# Patient Record
Sex: Female | Born: 1990 | Race: White | Hispanic: No | Marital: Married | Smoking: Never smoker
Health system: Southern US, Community
[De-identification: ages and names within clinical notes are randomized; demographics above are authoritative.]

## PROBLEM LIST (undated history)

## (undated) DIAGNOSIS — F429 Obsessive-compulsive disorder, unspecified: Secondary | ICD-10-CM

## (undated) DIAGNOSIS — T7840XA Allergy, unspecified, initial encounter: Secondary | ICD-10-CM

## (undated) DIAGNOSIS — F32A Depression, unspecified: Secondary | ICD-10-CM

## (undated) DIAGNOSIS — K589 Irritable bowel syndrome without diarrhea: Secondary | ICD-10-CM

## (undated) DIAGNOSIS — E669 Obesity, unspecified: Secondary | ICD-10-CM

## (undated) DIAGNOSIS — G4730 Sleep apnea, unspecified: Secondary | ICD-10-CM

## (undated) DIAGNOSIS — D649 Anemia, unspecified: Secondary | ICD-10-CM

## (undated) DIAGNOSIS — F419 Anxiety disorder, unspecified: Secondary | ICD-10-CM

## (undated) DIAGNOSIS — I10 Essential (primary) hypertension: Secondary | ICD-10-CM

## (undated) HISTORY — DX: Sleep apnea, unspecified: G47.30

## (undated) HISTORY — DX: Obesity, unspecified: E66.9

## (undated) HISTORY — DX: Obsessive-compulsive disorder, unspecified: F42.9

## (undated) HISTORY — DX: Anxiety disorder, unspecified: F41.9

## (undated) HISTORY — DX: Allergy, unspecified, initial encounter: T78.40XA

## (undated) HISTORY — DX: Depression, unspecified: F32.A

## (undated) HISTORY — DX: Irritable bowel syndrome, unspecified: K58.9

## (undated) HISTORY — DX: Essential (primary) hypertension: I10

## (undated) HISTORY — PX: TONSILLECTOMY AND ADENOIDECTOMY: SUR1326

## (undated) HISTORY — DX: Anemia, unspecified: D64.9

## (undated) HISTORY — PX: DILATION AND CURETTAGE OF UTERUS: SHX78

---

## 2012-02-08 DIAGNOSIS — H5210 Myopia, unspecified eye: Secondary | ICD-10-CM | POA: Insufficient documentation

## 2012-06-29 ENCOUNTER — Emergency Department: Payer: Self-pay | Admitting: Emergency Medicine

## 2012-06-29 LAB — URINALYSIS, COMPLETE
Bilirubin,UR: NEGATIVE
Blood: NEGATIVE
Glucose,UR: NEGATIVE mg/dL (ref 0–75)
Nitrite: NEGATIVE
Protein: NEGATIVE
RBC,UR: 2 /HPF (ref 0–5)
Squamous Epithelial: 21
WBC UR: 3 /HPF (ref 0–5)

## 2012-06-29 LAB — LIPASE, BLOOD: Lipase: 83 U/L (ref 73–393)

## 2012-06-29 LAB — COMPREHENSIVE METABOLIC PANEL
Anion Gap: 9 (ref 7–16)
Calcium, Total: 9.2 mg/dL (ref 8.5–10.1)
Co2: 21 mmol/L (ref 21–32)
Creatinine: 0.5 mg/dL — ABNORMAL LOW (ref 0.60–1.30)
Potassium: 3.7 mmol/L (ref 3.5–5.1)
SGOT(AST): 25 U/L (ref 15–37)
Total Protein: 7.4 g/dL (ref 6.4–8.2)

## 2012-06-29 LAB — CBC
HGB: 12.3 g/dL (ref 12.0–16.0)
MCH: 26.2 pg (ref 26.0–34.0)
MCHC: 33.4 g/dL (ref 32.0–36.0)
MCV: 78 fL — ABNORMAL LOW (ref 80–100)
Platelet: 256 10*3/uL (ref 150–440)
RBC: 4.68 10*6/uL (ref 3.80–5.20)
RDW: 15 % — ABNORMAL HIGH (ref 11.5–14.5)
WBC: 10.9 10*3/uL (ref 3.6–11.0)

## 2015-07-12 ENCOUNTER — Ambulatory Visit (INDEPENDENT_AMBULATORY_CARE_PROVIDER_SITE_OTHER): Payer: 59 | Admitting: Psychiatry

## 2015-07-12 ENCOUNTER — Encounter: Payer: Self-pay | Admitting: Psychiatry

## 2015-07-12 MED ORDER — TRAZODONE HCL 50 MG PO TABS: 50.0000 mg | ORAL_TABLET | Freq: Every day | ORAL | Status: DC

## 2015-07-12 MED ORDER — SERTRALINE HCL 100 MG PO TABS: 150.0000 mg | ORAL_TABLET | Freq: Every day | ORAL | Status: DC
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
## 2015-07-26 ENCOUNTER — Ambulatory Visit (INDEPENDENT_AMBULATORY_CARE_PROVIDER_SITE_OTHER): Payer: 59 | Admitting: Psychiatry

## 2015-08-05 ENCOUNTER — Ambulatory Visit (INDEPENDENT_AMBULATORY_CARE_PROVIDER_SITE_OTHER): Payer: 59 | Admitting: Psychiatry

## 2015-08-05 ENCOUNTER — Encounter: Payer: Self-pay | Admitting: Psychiatry

## 2015-08-05 VITALS — BP 138/88 | HR 120 | Temp 97.7°F | Ht 64.5 in | Wt 380.6 lb

## 2015-08-05 DIAGNOSIS — F429 Obsessive-compulsive disorder, unspecified: Secondary | ICD-10-CM | POA: Diagnosis not present

## 2015-08-05 DIAGNOSIS — F3342 Major depressive disorder, recurrent, in full remission: Secondary | ICD-10-CM | POA: Insufficient documentation

## 2015-08-05 DIAGNOSIS — F3341 Major depressive disorder, recurrent, in partial remission: Secondary | ICD-10-CM | POA: Insufficient documentation

## 2015-08-05 DIAGNOSIS — F331 Major depressive disorder, recurrent, moderate: Secondary | ICD-10-CM

## 2015-08-05 MED ORDER — SERTRALINE HCL 100 MG PO TABS: 200.0000 mg | ORAL_TABLET | Freq: Every day | ORAL | Status: DC

## 2015-08-05 NOTE — Progress Notes (Signed)
Patient ID: Nancy Molina Vandyne, female   DOB: 11/26/1990, 25 y.o.   MRN: [id number]  Psychiatric Progress note  Patient Identification: Nancy Molina [place] MRN:  [id number] Date of Evaluation:  08/05/2015 Chief Complaint:   Chief Complaint    Follow-up; Medication Refill; Anxiety     Visit Diagnosis: MDD, OCD, anxiety Diagnosis:  There are no active problems to display for this patient.  History of Present Illness:  Patient is a 25 year old Caucasian female with a long history of major depressive disorder, obsessive-compulsive disorder and binge eating disorder. She presents today for a follow up.  She reports that she has been sleeping much better on the trazodone. Getting up to 7-8 hours of sleep. She reports that increasing the Zoloft to 150 mg has helped with the depression. On a scale of 1-10 with 10 being her best mood and one the worst she is at a 5. However reports that she has been noticing more obsessive-compulsive symptoms. She notices that she counts and checks more. She also agrees that maybe she is more  present to notice these symptoms. Things are better at home. She is enjoying her life better. She does report that the OCD symptoms do bother her. Her binging has reduced from about 3-4 times a week to about once a week.  Past Medical History:  Past Medical History  Diagnosis Date  . Obsessive-compulsive disorder   . Anxiety   . Obesity     Past Surgical History  Procedure Laterality Date  . Tonsillectomy and adenoidectomy    . Dilation and curettage of uterus     Family History:  Family History  Problem Relation Age of Onset  . Obesity Father   . Heart disease Father   . Alcohol abuse Father   . Depression Father   . Polycystic ovary syndrome Sister    Social History:   Social History   Social History  . Marital Status: Married    Spouse Name: N/A  . Number of Children: N/A  . Years of Education: N/A   Social History Main Topics  . Smoking status: Never Smoker    . Smokeless tobacco: Never Used  . Alcohol Use: No  . Drug Use: No  . Sexual Activity: Yes    Birth Control/ Protection: Pill   Other Topics Concern  . None   Social History Narrative   Additional Social History: Patient is married and has a 26-year-old child. She recently moved from [place] and her mother and sister also live close by. Reports a supportive relationship with the family.  Musculoskeletal: Strength & Muscle Tone: within normal limits Gait & Station: normal Patient leans: N/A  Psychiatric Specialty Exam: Anxiety      ROS  Blood pressure 138/88, pulse 120, temperature 97.7 F (36.5 C), temperature source Tympanic, height 5' 4.5" (1.638 m), weight 380 lb 9.6 oz (172.639 kg), last menstrual period 07/19/2015, SpO2 97 %.Body mass index is 64.34 kg/(m^2).  General Appearance: Casual  Eye Contact:  Fair  Speech:  Clear and Coherent  Volume:  Normal  Mood:  improved  Affect:  brighter  Thought Process:  Coherent  Orientation:  Full (Time, Place, and Person)  Thought Content:  WDL  Suicidal Thoughts:  No  Homicidal Thoughts:  No  Memory:  Immediate;   Fair Recent;   Fair Remote;   Fair  Judgement:  Fair  Insight:  Fair  Psychomotor Activity:  Normal  Concentration:  Fair  Recall:  [employer]  Fund of Knowledge:Fair  Language: Fair  Akathisia:  No  Handed:  Right  AIMS (if indicated):  na  Assets:  Communication Skills Desire for Improvement Financial Resources/Insurance Housing Social Support Vocational/Educational  ADL's:  Intact  Cognition: WNL  Sleep:  poor   Is the patient at risk to self?  No. Has the patient been a risk to self in the past 6 months?  No. Has the patient been a risk to self within the distant past?  No. Is the patient a risk to others?  No. Has the patient been a risk to others in the past 6 months?  No. Has the patient been a risk to others within the distant past?  No.  Allergies:   Allergies  Allergen Reactions  .  Clindamycin/Lincomycin Shortness Of Breath  . Methocarbamol Shortness Of Breath  . Clindamycin Swelling   Current Medications: Current Outpatient Prescriptions  Medication Sig Dispense Refill  . Loratadine 10 MG CAPS Take by mouth.    . sertraline (ZOLOFT) 100 MG tablet Take 1.5 tablets (150 mg total) by mouth daily. 45 tablet 1  . SPRINTEC 28 0.25-35 MG-MCG tablet Take 1 tablet by mouth daily.  0  . traZODone (DESYREL) 50 MG tablet Take 1 tablet (50 mg total) by mouth at bedtime. 30 tablet 1   No current facility-administered medications for this visit.    Previous Psychotropic Medications: No   Substance Abuse History in the last 12 months:  No.  Consequences of Substance Abuse: Negative  Medical Decision Making:  Review of Psycho-Social Stressors (1), Review or order clinical lab tests (1), Review and summation of old records (2), Established Problem, Worsening (2), Review of Medication Regimen & Side Effects (2) and Review of New Medication or Change in Dosage (2)  Treatment Plan Summary: Medication management Major depressive disorder  Increase Zoloft to 200 mg daily Discussed taking walks and increasing exercise, spending time with family Recommend starting to see a therapist  Obsessive compulsive disorder/ anxiety Same as above  Insomnia Continue trazodone at 50 mg by mouth daily at bedtime   binge eating disorder Consider starting Vyvanse once anxiety and depression are under control.  Return to clinic in 4 weeks time or call before if necessary    Claretta Kendra 3/23/20178:44 AM

## 2015-08-26 ENCOUNTER — Ambulatory Visit: Payer: 59 | Admitting: Psychiatry

## 2015-09-01 ENCOUNTER — Other Ambulatory Visit: Payer: Self-pay | Admitting: Psychiatry

## 2015-09-01 NOTE — Telephone Encounter (Signed)
I approved the effexor, please have another doctor sign the prescription or call it in. thanks

## 2015-09-01 NOTE — Telephone Encounter (Signed)
pt needs refill on her trazodone.  pt was last seen on  08-05-15 next appt  09-09-15

## 2015-09-09 ENCOUNTER — Ambulatory Visit: Payer: 59 | Admitting: Psychiatry

## 2015-09-16 ENCOUNTER — Encounter: Payer: Self-pay | Admitting: Psychiatry

## 2015-09-16 ENCOUNTER — Ambulatory Visit (INDEPENDENT_AMBULATORY_CARE_PROVIDER_SITE_OTHER): Payer: 59 | Admitting: Psychiatry

## 2015-09-16 VITALS — BP 124/84 | HR 121 | Temp 97.6°F | Ht 64.5 in | Wt 381.4 lb

## 2015-09-16 DIAGNOSIS — F429 Obsessive-compulsive disorder, unspecified: Secondary | ICD-10-CM

## 2015-09-16 DIAGNOSIS — F331 Major depressive disorder, recurrent, moderate: Secondary | ICD-10-CM

## 2015-09-16 MED ORDER — SERTRALINE HCL 100 MG PO TABS: 200.0000 mg | ORAL_TABLET | Freq: Every day | ORAL | Status: DC

## 2015-09-16 MED ORDER — TRAZODONE HCL 50 MG PO TABS: 50.0000 mg | ORAL_TABLET | Freq: Every day | ORAL | Status: DC

## 2015-09-16 NOTE — Progress Notes (Signed)
Patient ID: Nancy Molina, female   DOB: 08/05/1990, 25 y.o.   MRN: [id number]   Psychiatric Progress note  Patient Identification: Nancy Molina MRN:  [id number] Date of Evaluation:  09/16/2015 Chief Complaint:    Visit Diagnosis: MDD, OCD, anxiety Diagnosis:   Patient Active Problem List   Diagnosis Date Noted  . MDD (major depressive disorder), recurrent episode, moderate (HCC) [F33.1] 08/05/2015  . OCD (obsessive compulsive disorder) [F42.9] 08/05/2015   History of Present Illness:  Patient is a 25 year old Caucasian female with a long history of major depressive disorder, obsessive-compulsive disorder and binge eating disorder. She presents today for a follow up.  She has been able to tolerate the zoloft at  200mg , mood has significantly improved. States she is sleeping well and eating healthy. She has not been bingeing recently. Her OCD is better but states 40% of her daily time is spent in OCD rituals such as counting , knocking on things when anxious, checking things. States it is tolerable though. She is quite anxious about A cousin's upcoming wedding and all the activities surrounding it. States that she will have to throw the bridal shower and organize a few other things and this stresses her out. Overall reports her mood has improved but she continues to have some significant anxiety related to everyday things.    Past Medical History:  Past Medical History  Diagnosis Date  . Obsessive-compulsive disorder   . Anxiety   . Obesity     Past Surgical History  Procedure Laterality Date  . Tonsillectomy and adenoidectomy    . Dilation and curettage of uterus     Family History:  Family History  Problem Relation Age of Onset  . Obesity Father   . Heart disease Father   . Alcohol abuse Father   . Depression Father   . Polycystic ovary syndrome Sister    Social History:   Social History   Social History  . Marital Status: Married    Spouse Name: N/A  . Number of  Children: N/A  . Years of Education: N/A   Social History Main Topics  . Smoking status: Never Smoker   . Smokeless tobacco: Never Used  . Alcohol Use: No  . Drug Use: No  . Sexual Activity: Yes    Birth Control/ Protection: Pill   Other Topics Concern  . Not on file   Social History Narrative   Additional Social History: Patient is married and has a 26-year-old child. She recently moved from [place] and her mother and sister also live close by. Reports a supportive relationship with the family.  Musculoskeletal: Strength & Muscle Tone: within normal limits Gait & Station: normal Patient leans: N/A  Psychiatric Specialty Exam: Anxiety      ROS  There were no vitals taken for this visit.There is no weight on file to calculate BMI.  General Appearance: Casual  Eye Contact:  Fair  Speech:  Clear and Coherent  Volume:  Normal  Mood:  improved  Affect:  brighter  Thought Process:  Coherent  Orientation:  Full (Time, Place, and Person)  Thought Content:  WDL  Suicidal Thoughts:  No  Homicidal Thoughts:  No  Memory:  Immediate;   Fair Recent;   Fair Remote;   Fair  Judgement:  Fair  Insight:  Fair  Psychomotor Activity:  Normal  Concentration:  Fair  Recall:  [employer]  Fund of Knowledge:Fair  Language: Fair  Akathisia:  No  Handed:  Right  AIMS (  if indicated):  na  Assets:  Communication Skills Desire for Improvement Financial Resources/Insurance Housing Social Support Vocational/Educational  ADL's:  Intact  Cognition: WNL  Sleep:  poor   Is the patient at risk to self?  No. Has the patient been a risk to self in the past 6 months?  No. Has the patient been a risk to self within the distant past?  No. Is the patient a risk to others?  No. Has the patient been a risk to others in the past 6 months?  No. Has the patient been a risk to others within the distant past?  No.  Allergies:   Allergies  Allergen Reactions  . Clindamycin/Lincomycin Shortness Of  Breath  . Methocarbamol Shortness Of Breath  . Clindamycin Swelling   Current Medications: Current Outpatient Prescriptions  Medication Sig Dispense Refill  . Loratadine 10 MG CAPS Take by mouth.    . sertraline (ZOLOFT) 100 MG tablet Take 2 tablets (200 mg total) by mouth daily. 60 tablet 1  . SPRINTEC 28 0.25-35 MG-MCG tablet Take 1 tablet by mouth daily.  0  . traZODone (DESYREL) 50 MG tablet TAKE 1 TABLET BY MOUTH AT BEDTIME 30 tablet 0   No current facility-administered medications for this visit.    Previous Psychotropic Medications: No   Substance Abuse History in the last 12 months:  No.  Consequences of Substance Abuse: Negative  Medical Decision Making:  Review of Psycho-Social Stressors (1), Review or order clinical lab tests (1), Review and summation of old records (2), Established Problem, Worsening (2), Review of Medication Regimen & Side Effects (2) and Review of New Medication or Change in Dosage (2)  Treatment Plan Summary: Medication management Major depressive disorder  Continue Zoloft at 200 mg daily Discussed taking walks and increasing exercise, spending time with family Recommend starting to see a therapist, she is agreeable to starting now.  Obsessive compulsive disorder/ anxiety Same as above  Insomnia Continue trazodone at 50 mg by mouth daily at bedtime   binge eating disorder   Return to clinic in 4 weeks time or call before if necessary    Fynley Chrystal 5/4/201711:41 AM

## 2015-10-01 ENCOUNTER — Ambulatory Visit: Payer: 59 | Admitting: Licensed Clinical Social Worker

## 2015-10-12 ENCOUNTER — Ambulatory Visit (INDEPENDENT_AMBULATORY_CARE_PROVIDER_SITE_OTHER): Payer: 59 | Admitting: Licensed Clinical Social Worker

## 2015-10-12 DIAGNOSIS — F429 Obsessive-compulsive disorder, unspecified: Secondary | ICD-10-CM | POA: Diagnosis not present
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
## 2015-11-15 ENCOUNTER — Ambulatory Visit (INDEPENDENT_AMBULATORY_CARE_PROVIDER_SITE_OTHER): Payer: 59 | Admitting: Psychiatry

## 2015-11-15 ENCOUNTER — Encounter: Payer: Self-pay | Admitting: Psychiatry

## 2015-11-15 VITALS — BP 138/88 | HR 116 | Temp 97.3°F | Ht 64.5 in | Wt 380.0 lb

## 2015-11-15 DIAGNOSIS — F331 Major depressive disorder, recurrent, moderate: Secondary | ICD-10-CM

## 2015-11-15 DIAGNOSIS — F429 Obsessive-compulsive disorder, unspecified: Secondary | ICD-10-CM | POA: Diagnosis not present

## 2015-11-15 MED ORDER — TRAZODONE HCL 50 MG PO TABS: 50.0000 mg | ORAL_TABLET | Freq: Every day | ORAL | Status: DC

## 2015-11-15 MED ORDER — SERTRALINE HCL 100 MG PO TABS: 200.0000 mg | ORAL_TABLET | Freq: Every day | ORAL | Status: DC

## 2015-11-15 NOTE — Progress Notes (Signed)
Patient ID: Nancy Molina, female   DOB: 07/07/1990, 25 y.o.   MRN: [id number]    Psychiatric Progress note  Patient Identification: Nancy Molina MRN:  [id number] Date of Evaluation:  11/15/2015 Chief Complaint:    Visit Diagnosis: MDD, OCD, anxiety Diagnosis:   Patient Active Problem List   Diagnosis Date Noted  . MDD (major depressive disorder), recurrent episode, moderate (HCC) [F33.1] 08/05/2015  . OCD (obsessive compulsive disorder) [F42.9] 08/05/2015   History of Present Illness:  Patient is a 25 year old Caucasian female with a long history of major depressive disorder, obsessive-compulsive disorder and binge eating disorder. She presents today for a follow up.  Reports her mood has leveled out, sleeping better. States she is not bingeing, cannot recall the last time she has binged. Reports her OCD has improved as well. Patient reports that she continues to have anxiety about something happening to her family members. However she does realize that his anxiety and is able to mourn with her day. Denies any suicidal thoughts. Continues to stress somewhat about her cousin's wedding next month but doing okay overall.  Past Medical History:  Past Medical History  Diagnosis Date  . Obsessive-compulsive disorder   . Anxiety   . Obesity     Past Surgical History  Procedure Laterality Date  . Tonsillectomy and adenoidectomy    . Dilation and curettage of uterus     Family History:  Family History  Problem Relation Age of Onset  . Obesity Father   . Heart disease Father   . Alcohol abuse Father   . Depression Father   . Polycystic ovary syndrome Sister    Social History:   Social History   Social History  . Marital Status: Married    Spouse Name: N/A  . Number of Children: N/A  . Years of Education: N/A   Social History Main Topics  . Smoking status: Never Smoker   . Smokeless tobacco: Never Used  . Alcohol Use: No  . Drug Use: No  . Sexual Activity: Yes   Birth Control/ Protection: Pill   Other Topics Concern  . Not on file   Social History Narrative   Additional Social History: Patient is married and has a 26-year-old child. She recently moved from [place] and her mother and sister also live close by. Reports a supportive relationship with the family.  Musculoskeletal: Strength & Muscle Tone: within normal limits Gait & Station: normal Patient leans: N/A  Psychiatric Specialty Exam: Anxiety      ROS  There were no vitals taken for this visit.There is no weight on file to calculate BMI.  General Appearance: Casual  Eye Contact:  Fair  Speech:  Clear and Coherent  Volume:  Normal  Mood:  improved  Affect:  brighter  Thought Process:  Coherent  Orientation:  Full (Time, Place, and Person)  Thought Content:  WDL  Suicidal Thoughts:  No  Homicidal Thoughts:  No  Memory:  Immediate;   Fair Recent;   Fair Remote;   Fair  Judgement:  Fair  Insight:  Fair  Psychomotor Activity:  Normal  Concentration:  Fair  Recall:  [employer]  Fund of Knowledge:Fair  Language: Fair  Akathisia:  No  Handed:  Right  AIMS (if indicated):  na  Assets:  Communication Skills Desire for Improvement Financial Resources/Insurance Housing Social Support Vocational/Educational  ADL's:  Intact  Cognition: WNL  Sleep:  poor   Is the patient at risk to self?  No. Has the  patient been a risk to self in the past 6 months?  No. Has the patient been a risk to self within the distant past?  No. Is the patient a risk to others?  No. Has the patient been a risk to others in the past 6 months?  No. Has the patient been a risk to others within the distant past?  No.  Allergies:   Allergies  Allergen Reactions  . Clindamycin/Lincomycin Shortness Of Breath  . Methocarbamol Shortness Of Breath  . Clindamycin Swelling   Current Medications: Current Outpatient Prescriptions  Medication Sig Dispense Refill  . Loratadine 10 MG CAPS Take by mouth.  Reported on 09/16/2015    . sertraline (ZOLOFT) 100 MG tablet Take 2 tablets (200 mg total) by mouth daily. 60 tablet 1  . SPRINTEC 28 0.25-35 MG-MCG tablet Take 1 tablet by mouth daily.  0  . traZODone (DESYREL) 50 MG tablet Take 1 tablet (50 mg total) by mouth at bedtime. 30 tablet 1   No current facility-administered medications for this visit.    Previous Psychotropic Medications: No   Substance Abuse History in the last 12 months:  No.  Consequences of Substance Abuse: Negative  Medical Decision Making:  Review of Psycho-Social Stressors (1), Review or order clinical lab tests (1), Review and summation of old records (2), Established Problem, Worsening (2), Review of Medication Regimen & Side Effects (2) and Review of New Medication or Change in Dosage (2)  Treatment Plan Summary: Medication management Major depressive disorder  Continue Zoloft at 200 mg daily Discussed taking walks and increasing exercise, spending time with family Started seeing [name] for therapy.  Obsessive compulsive disorder/ anxiety Same as above  Insomnia Continue trazodone at 50 mg by mouth daily at bedtime   binge eating disorder Doing well  Return to clinic in 3 months  time or call before if necessary    Jacquelin Krajewski 7/3/20178:52 AM

## 2016-02-15 ENCOUNTER — Ambulatory Visit: Payer: 59 | Admitting: Psychiatry

## 2016-02-17 ENCOUNTER — Other Ambulatory Visit: Payer: Self-pay | Admitting: Psychiatry

## 2016-02-23 ENCOUNTER — Ambulatory Visit (INDEPENDENT_AMBULATORY_CARE_PROVIDER_SITE_OTHER): Payer: 59 | Admitting: Psychiatry

## 2016-02-23 ENCOUNTER — Encounter: Payer: Self-pay | Admitting: Psychiatry

## 2016-02-23 VITALS — BP 152/97 | HR 101 | Temp 98.0°F | Wt 386.2 lb

## 2016-02-23 DIAGNOSIS — F429 Obsessive-compulsive disorder, unspecified: Secondary | ICD-10-CM

## 2016-02-23 DIAGNOSIS — F331 Major depressive disorder, recurrent, moderate: Secondary | ICD-10-CM

## 2016-02-23 MED ORDER — LISDEXAMFETAMINE DIMESYLATE 20 MG PO CAPS: 20.0000 mg | ORAL_CAPSULE | Freq: Every day | ORAL | 0 refills | Status: DC

## 2016-02-23 MED ORDER — TRAZODONE HCL 50 MG PO TABS: 50.0000 mg | ORAL_TABLET | Freq: Every day | ORAL | 3 refills | Status: DC

## 2016-02-23 MED ORDER — SERTRALINE HCL 100 MG PO TABS: 200.0000 mg | ORAL_TABLET | Freq: Every day | ORAL | 3 refills | Status: DC

## 2016-02-23 NOTE — Progress Notes (Signed)
Patient ID: Nancy Molina, female   DOB: January 01, 1991, 25 y.o.   MRN: [id number]    Psychiatric Progress note  Patient Identification: Nancy Molina MRN:  [id number] Date of Evaluation:  02/23/2016 Chief Complaint:   Chief Complaint    Follow-up; Medication Refill     Visit Diagnosis: MDD, OCD, anxiety Diagnosis:   Patient Active Problem List   Diagnosis Date Noted  . MDD (major depressive disorder), recurrent episode, moderate (HCC) [F33.1] 08/05/2015  . OCD (obsessive compulsive disorder) [F42.9] 08/05/2015   History of Present Illness:  Patient is a 25 year old Caucasian female with a long history of major depressive disorder, obsessive-compulsive disorder and binge eating disorder. Patient today reports that she's been doing well in terms of her anxiety and depression. She also states that the OCD has gotten much better. Sleeping Well and eating okay. She denies any binge eating episodes. However she does report that down she does have to eat more than usual given her on early years of the binging. She is interested in looking at the further treatment for the binge eating. She also reports that the stressor which was her cousins wedding was called off and though it was a bit stressful she is doing better. She and her husband are moving into a new house and she is excited about that. Past Medical History:  Past Medical History:  Diagnosis Date  . Anxiety   . Obesity   . Obsessive-compulsive disorder     Past Surgical History:  Procedure Laterality Date  . DILATION AND CURETTAGE OF UTERUS    . TONSILLECTOMY AND ADENOIDECTOMY     Family History:  Family History  Problem Relation Age of Onset  . Obesity Father   . Heart disease Father   . Alcohol abuse Father   . Depression Father   . Polycystic ovary syndrome Sister    Social History:   Social History   Social History  . Marital status: Married    Spouse name: N/A  . Number of children: N/A  . Years of education:  N/A   Social History Main Topics  . Smoking status: Never Smoker  . Smokeless tobacco: Never Used  . Alcohol use No  . Drug use: No  . Sexual activity: Yes    Birth control/ protection: Pill   Other Topics Concern  . None   Social History Narrative  . None   Additional Social History: Patient is married and has a 31-year-old child. She recently moved from [place] and her mother and sister also live close by. Reports a supportive relationship with the family.  Musculoskeletal: Strength & Muscle Tone: within normal limits Gait & Station: normal Patient leans: N/A  Psychiatric Specialty Exam: Anxiety     Medication Refill     ROS  Blood pressure (!) 152/97, pulse (!) 101, temperature 98 F (36.7 C), temperature source Oral, weight (!) 386 lb 3.2 oz (175.2 kg), last menstrual period 02/20/2016.Body mass index is 65.27 kg/m.  General Appearance: Casual  Eye Contact:  Fair  Speech:  Clear and Coherent  Volume:  Normal  Mood:  improved  Affect:  brighter  Thought Process:  Coherent  Orientation:  Full (Time, Place, and Person)  Thought Content:  WDL  Suicidal Thoughts:  No  Homicidal Thoughts:  No  Memory:  Immediate;   Fair Recent;   Fair Remote;   Fair  Judgement:  Fair  Insight:  Fair  Psychomotor Activity:  Normal  Concentration:  Fair  Recall:  Fair  [employer] EnergyFund of Knowledge:Fair  Language: Fair  Akathisia:  No  Handed:  Right  AIMS (if indicated):  na  Assets:  Communication Skills Desire for Improvement Financial Resources/Insurance Housing Social Support Vocational/Educational  ADL's:  Intact  Cognition: WNL  Sleep:  better   Is the patient at risk to self?  No. Has the patient been a risk to self in the past 6 months?  No. Has the patient been a risk to self within the distant past?  No. Is the patient a risk to others?  No. Has the patient been a risk to others in the past 6 months?  No. Has the patient been a risk to others within the distant  past?  No.  Allergies:   Allergies  Allergen Reactions  . Clindamycin/Lincomycin Shortness Of Breath  . Methocarbamol Shortness Of Breath  . Clindamycin Swelling   Current Medications: Current Outpatient Prescriptions  Medication Sig Dispense Refill  . Loratadine 10 MG CAPS Take by mouth. Reported on 09/16/2015    . sertraline (ZOLOFT) 100 MG tablet Take 2 tablets (200 mg total) by mouth daily. 60 tablet 2  . SPRINTEC 28 0.25-35 MG-MCG tablet Take 1 tablet by mouth daily.  0  . traZODone (DESYREL) 50 MG tablet Take 1 tablet (50 mg total) by mouth at bedtime. 30 tablet 2   No current facility-administered medications for this visit.     Previous Psychotropic Medications: No   Substance Abuse History in the last 12 months:  No.  Consequences of Substance Abuse: Negative  Medical Decision Making:  Review of Psycho-Social Stressors (1), Review or order clinical lab tests (1), Review and summation of old records (2), Established Problem, Worsening (2), Review of Medication Regimen & Side Effects (2) and Review of New Medication or Change in Dosage (2)  Treatment Plan Summary: Medication management   Major depressive disorder  Continue Zoloft at 200 mg daily Discussed taking walks and increasing exercise, spending time with family  Obsessive compulsive disorder/ anxiety Same as above  Insomnia Continue trazodone at 50 mg by mouth daily at bedtime   binge eating disorder Start Vyvanse at 20mg  po qam.  High blood pressure and weight Patient reports that she is stressed because of the move to the new house and the she and her husband are planning to eat healthy and buy a treadmill as well. Patient recommended to follow up with her primary care physician to address any hypertension issues  Return to clinic in 1 months  time or call before if necessary    Nancy Molina 10/11/20179:03 AM

## 2016-03-21 ENCOUNTER — Encounter: Payer: Self-pay | Admitting: Psychiatry

## 2016-03-21 ENCOUNTER — Ambulatory Visit (INDEPENDENT_AMBULATORY_CARE_PROVIDER_SITE_OTHER): Payer: 59 | Admitting: Psychiatry

## 2016-03-21 VITALS — BP 136/86 | HR 97 | Temp 97.5°F | Resp 18 | Wt 386.0 lb

## 2016-03-21 DIAGNOSIS — F50814 Binge eating disorder, in remission: Secondary | ICD-10-CM

## 2016-03-21 DIAGNOSIS — F331 Major depressive disorder, recurrent, moderate: Secondary | ICD-10-CM | POA: Diagnosis not present

## 2016-03-21 DIAGNOSIS — F429 Obsessive-compulsive disorder, unspecified: Secondary | ICD-10-CM

## 2016-03-21 DIAGNOSIS — F5081 Binge eating disorder: Secondary | ICD-10-CM

## 2016-03-21 MED ORDER — LISDEXAMFETAMINE DIMESYLATE 30 MG PO CAPS: 30.0000 mg | ORAL_CAPSULE | Freq: Every day | ORAL | 0 refills | Status: DC

## 2016-03-21 NOTE — Progress Notes (Signed)
Patient ID: Nancy Molina, female   DOB: 08/27/1990, 25 y.o.   MRN: [id number]    Psychiatric Progress note  Patient Identification: Nancy Molina MRN:  [id number] Date of Evaluation:  03/21/2016 Chief Complaint:    Visit Diagnosis: MDD, OCD, anxiety Diagnosis:   There are no active problems to display for this patient.  History of Present Illness:  Patient is a 25 year old Caucasian female with a long history of major depressive disorder, obsessive-compulsive disorder and binge eating disorder. Patient today reports that she's been doing well in terms of her anxiety and depression.Patient reports that she has tolerated the Vyvanse well and states it has curbed her appetite somewhat in the mornings. However reports that it seems to wear off in the afternoon. States that she is working on retraining herself to eat less. Reports that her mood is doing good overall and denies any problems. She has been sleeping well. Her OCD is also more stable.. Past Medical History:  Past Medical History:  Diagnosis Date  . Anxiety   . Obesity   . Obsessive-compulsive disorder     Past Surgical History:  Procedure Laterality Date  . DILATION AND CURETTAGE OF UTERUS    . TONSILLECTOMY AND ADENOIDECTOMY     Family History:  Family History  Problem Relation Age of Onset  . Obesity Father   . Heart disease Father   . Alcohol abuse Father   . Depression Father   . Polycystic ovary syndrome Sister    Social History:   Social History   Social History  . Marital status: Married    Spouse name: N/A  . Number of children: N/A  . Years of education: N/A   Social History Main Topics  . Smoking status: Never Smoker  . Smokeless tobacco: Never Used  . Alcohol use No  . Drug use: No  . Sexual activity: Yes    Birth control/ protection: Pill   Other Topics Concern  . None   Social History Narrative  . None   Additional Social History: Patient is married and has a 26-year-old child. She  recent[place] moved from [place] and her mother and sister also live close by. Reports a supportive relationship with the family.  Musculoskeletal: Strength & Muscle Tone: within normal limits Gait & Station: normal Patient leans: N/A  Psychiatric Specialty Exam: Medication Refill   Anxiety       ROS  Blood pressure 136/86, pulse 97, temperature 97.5 F (36.4 C), temperature source Tympanic, resp. rate 18, weight (!) 386 lb (175.1 kg), last menstrual period 02/20/2016, SpO2 98 %.Body mass index is 65.23 kg/m.  General Appearance: Casual  Eye Contact:  Fair  Speech:  Clear and Coherent  Volume:  Normal  Mood:  Good   Affect:  Full range   Thought Process:  Coherent  Orientation:  Full (Time, Place, and Person)  Thought Content:  WDL  Suicidal Thoughts:  No  Homicidal Thoughts:  No  Memory:  Immediate;   Fair Recent;   Fair Remote;   Fair  Judgement:  Fair  Insight:  Fair  Psychomotor Activity:  Normal  Concentration:  Fair  Recall:  [employer]  Fund of Knowledge:Fair  Language: Fair  Akathisia:  No  Handed:  Right  AIMS (if indicated):  na  Assets:  Communication Skills Desire for Improvement Financial Resources/Insurance Housing Social Support Vocational/Educational  ADL's:  Intact  Cognition: WNL  Sleep:  better   Is the patient at risk to self?  No.  Has the patient been a risk to self in the past 6 months?  No. Has the patient been a risk to self within the distant past?  No. Is the patient a risk to others?  No. Has the patient been a risk to others in the past 6 months?  No. Has the patient been a risk to others within the distant past?  No.  Allergies:   Allergies  Allergen Reactions  . Clindamycin/Lincomycin Shortness Of Breath  . Methocarbamol Shortness Of Breath  . Clindamycin Swelling   Current Medications: Current Outpatient Prescriptions  Medication Sig Dispense Refill  . lisdexamfetamine (VYVANSE) 30 MG capsule Take 1 capsule (30 mg total) by  mouth daily. 30 capsule 0  . Loratadine 10 MG CAPS Take by mouth. Reported on 09/16/2015    . sertraline (ZOLOFT) 100 MG tablet Take 2 tablets (200 mg total) by mouth daily. 60 tablet 3  . SPRINTEC 28 0.25-35 MG-MCG tablet Take 1 tablet by mouth daily.  0  . traZODone (DESYREL) 50 MG tablet Take 1 tablet (50 mg total) by mouth at bedtime. 30 tablet 3   No current facility-administered medications for this visit.     Previous Psychotropic Medications: No   Substance Abuse History in the last 12 months:  No.  Consequences of Substance Abuse: Negative  Medical Decision Making:  Review of Psycho-Social Stressors (1), Review or order clinical lab tests (1), Review and summation of old records (2), Established Problem, Worsening (2), Review of Medication Regimen & Side Effects (2) and Review of New Medication or Change in Dosage (2)  Treatment Plan Summary: Medication management   Major depressive disorder  Continue Zoloft at 200 mg daily Discussed taking walks and increasing exercise, spending time with family  Obsessive compulsive disorder/ anxiety Same as above  Insomnia Continue trazodone at 50 mg by mouth daily at bedtime   binge eating disorder Increase Vyvanse to 30 mg once daily Diet therapy with Nancy Molina to address issues around the binge eating   Return to clinic in 1 months  time or call before if necessary    Nancy Molina 11/7/20178:58 AM

## 2016-04-19 ENCOUNTER — Ambulatory Visit: Payer: 59 | Admitting: Licensed Clinical Social Worker

## 2016-04-19 ENCOUNTER — Ambulatory Visit: Payer: 59 | Admitting: Psychiatry

## 2019-03-25 DIAGNOSIS — H5213 Myopia, bilateral: Secondary | ICD-10-CM | POA: Insufficient documentation

## 2019-10-15 ENCOUNTER — Ambulatory Visit: Payer: BC Managed Care – PPO | Admitting: Nurse Practitioner

## 2019-10-17 ENCOUNTER — Other Ambulatory Visit: Payer: Self-pay

## 2019-10-21 ENCOUNTER — Ambulatory Visit (INDEPENDENT_AMBULATORY_CARE_PROVIDER_SITE_OTHER): Payer: BC Managed Care – PPO | Admitting: Nurse Practitioner

## 2019-10-21 ENCOUNTER — Encounter: Payer: Self-pay | Admitting: Nurse Practitioner

## 2019-10-21 ENCOUNTER — Other Ambulatory Visit: Payer: Self-pay

## 2019-10-21 VITALS — BP 126/84 | HR 115 | Temp 97.5°F | Ht 65.0 in | Wt >= 6400 oz

## 2019-10-21 DIAGNOSIS — F418 Other specified anxiety disorders: Secondary | ICD-10-CM | POA: Diagnosis not present

## 2019-10-21 DIAGNOSIS — Z0000 Encounter for general adult medical examination without abnormal findings: Secondary | ICD-10-CM | POA: Diagnosis not present

## 2019-10-21 LAB — CBC WITH DIFFERENTIAL/PLATELET
Basophils Absolute: 0.1 10*3/uL (ref 0.0–0.1)
Basophils Relative: 0.6 % (ref 0.0–3.0)
Eosinophils Absolute: 0.2 10*3/uL (ref 0.0–0.7)
Eosinophils Relative: 1.6 % (ref 0.0–5.0)
HCT: 39.4 % (ref 36.0–46.0)
Hemoglobin: 13 g/dL (ref 12.0–15.0)
Lymphocytes Relative: 15.4 % (ref 12.0–46.0)
Lymphs Abs: 1.9 10*3/uL (ref 0.7–4.0)
MCHC: 33 g/dL (ref 30.0–36.0)
MCV: 76.9 fl — ABNORMAL LOW (ref 78.0–100.0)
Monocytes Absolute: 0.7 10*3/uL (ref 0.1–1.0)
Monocytes Relative: 6.1 % (ref 3.0–12.0)
Neutro Abs: 9.3 10*3/uL — ABNORMAL HIGH (ref 1.4–7.7)
Neutrophils Relative %: 76.3 % (ref 43.0–77.0)
Platelets: 353 10*3/uL (ref 150.0–400.0)
RBC: 5.13 Mil/uL — ABNORMAL HIGH (ref 3.87–5.11)
RDW: 15.2 % (ref 11.5–15.5)
WBC: 12.2 10*3/uL — ABNORMAL HIGH (ref 4.0–10.5)

## 2019-10-21 LAB — LIPID PANEL
Cholesterol: 192 mg/dL (ref 0–200)
HDL: 46.1 mg/dL (ref >39.00)
LDL Cholesterol: 118 mg/dL — ABNORMAL HIGH (ref 0–99)
NonHDL: 145.84
Total CHOL/HDL Ratio: 4
Triglycerides: 137 mg/dL (ref 0.0–149.0)
VLDL: 27.4 mg/dL (ref 0.0–40.0)

## 2019-10-21 LAB — COMPREHENSIVE METABOLIC PANEL
ALT: 15 U/L (ref 0–35)
AST: 12 U/L (ref 0–37)
Albumin: 4.3 g/dL (ref 3.5–5.2)
Alkaline Phosphatase: 94 U/L (ref 39–117)
BUN: 10 mg/dL (ref 6–23)
CO2: 26 mEq/L (ref 19–32)
Calcium: 9.6 mg/dL (ref 8.4–10.5)
Chloride: 102 mEq/L (ref 96–112)
Creatinine, Ser: 0.62 mg/dL (ref 0.40–1.20)
GFR: 112.82 mL/min (ref >60.00)
Glucose, Bld: 99 mg/dL (ref 70–99)
Potassium: 4.1 mEq/L (ref 3.5–5.1)
Sodium: 137 mEq/L (ref 135–145)
Total Bilirubin: 0.4 mg/dL (ref 0.2–1.2)
Total Protein: 7.2 g/dL (ref 6.0–8.3)

## 2019-10-21 LAB — HEMOGLOBIN A1C: Hgb A1c MFr Bld: 6.2 % (ref 4.6–6.5)

## 2019-10-21 LAB — TSH: TSH: 1.57 u[IU]/mL (ref 0.35–4.50)

## 2019-10-21 LAB — VITAMIN D 25 HYDROXY (VIT D DEFICIENCY, FRACTURES): VITD: 43.33 ng/mL (ref 30.00–100.00)

## 2019-10-21 LAB — B12 AND FOLATE PANEL
Folate: >24.8 ng/mL (ref >5.9)
Vitamin B-12: 338 pg/mL (ref 211–911)

## 2019-10-21 MED ORDER — SERTRALINE HCL 50 MG PO TABS
50.0000 mg | ORAL_TABLET | Freq: Every day | ORAL | 3 refills | Status: DC
Start: 2019-10-21 — End: 2019-11-13

## 2019-10-21 NOTE — Progress Notes (Addendum)
New Patient Office Visit  Subjective:  Patient ID: Nancy Molina, female    DOB: [birth year]  Age: 29 y.o. MRN: [id number]  CC:  Chief Complaint  Patient presents with  . New Patient (Initial Visit)    establish care   HPI Nancy Molina is a 29 year old patient with history of  morbid obesity, depression, anxiety, irritable bowel syndrome-diarrhea who presents to establish care with a primary care provider.  She has been followed by her gynecologist in the past.  She is specifically requesting a psychiatric referral so that she can get back on her Zoloft.  She has been diagnosed with depression and anxiety in the past.  She felt that Zoloft really worked well for her, helped her lose weight. States she eventually weaned off of it is because she did not think she needed it any longer.  She denies any acute problems with depression and has no thoughts of self harm.  PHQ-9 is 16.  GAD-7 is 19.  Patient presents today for complete physical.  Immunizations: Advised with Covid vaccine first, and then could return for a tetanus vaccine.  She says she will think about this. Diet: Not special  Exercise: walks daily Pap Smear:sees GYN- make own appt  Vision: needs done Dentist: needs done Social: Non-smoker, no significant alcohol She is married, and has an autistic daughter who has significant special needs.  Patient is a homemaker.  Her husband is working at home.  She reports she has a stable home life and feels safe.  Past Medical History:  Diagnosis Date  . Anxiety   . IBS (irritable bowel syndrome)   . Obesity   . Obsessive-compulsive disorder     Past Surgical History:  Procedure Laterality Date  . DILATION AND CURETTAGE OF UTERUS    . TONSILLECTOMY AND ADENOIDECTOMY      Family History  Problem Relation Age of Onset  . Obesity Father   . Heart disease Father   . Alcohol abuse Father   . Depression Father   . Polycystic ovary syndrome Sister     Social History    Socioeconomic History  . Marital status: Married    Spouse name: Not on file  . Number of children: Not on file  . Years of education: Not on file  . Highest education level: Not on file  Occupational History  . Not on file  Tobacco Use  . Smoking status: Never Smoker  . Smokeless tobacco: Never Used  Substance and Sexual Activity  . Alcohol use: No    Alcohol/week: 0.0 standard drinks  . Drug use: No  . Sexual activity: Yes    Birth control/protection: Pill  Other Topics Concern  . Not on file  Social History Narrative  . Not on file   Social Determinants of Health   Financial Resource Strain:   . Difficulty of Paying Living Expenses:   Food Insecurity:   . Worried About Programme researcher, broadcasting/film/video in the Last Year:   . Barista in the Last Year:   Transportation Needs:   . Freight forwarder (Medical):   Marland Kitchen Lack of Transportation (Non-Medical):   Physical Activity:   . Days of Exercise per Week:   . Minutes of Exercise per Session:   Stress:   . Feeling of Stress :   Social Connections:   . Frequency of Communication with Friends and Family:   . Frequency of Social Gatherings with Friends and Family:   .  Attends Religious Services:   . Active Member of Clubs or Organizations:   . Attends Archivist Meetings:   Marland Kitchen Marital Status:   Intimate Partner Violence:   . Fear of Current or Ex-Partner:   . Emotionally Abused:   Marland Kitchen Physically Abused:   . Sexually Abused:     ROS Review of Systems  HENT: Negative for congestion.   Eyes: Negative.   Respiratory: Negative for cough and shortness of breath.   Cardiovascular: Negative for chest pain, palpitations and leg swelling.  Gastrointestinal: Negative for abdominal pain.       IBS stable  Genitourinary: Negative for difficulty urinating.  Allergic/Immunologic: Positive for environmental allergies.       Claritin controls  Neurological: Negative for dizziness and seizures.       Regular HA 1-2 per  week- mild and relieved with one Advil or Tylenol. Stress.   She gets more of a migraine HA x1 month-left posterior head, can't tolerate lights, loud noises, and 2 doses of Tylenol ES relieves. No n/v.These have been  lifelong- teenage . No hx of brain MRI.  She thinks SSRI may help if stress related.    Hematological: Negative for adenopathy. Does not bruise/bleed easily.  Psychiatric/Behavioral:       See HPI.     Objective:   Today's Vitals: BP 126/84 (BP Location: Left Arm, Patient Position: Sitting, Cuff Size: Large)   Pulse (!) 115   Temp (!) 97.5 F (36.4 C) (Skin)   Ht 5\' 5"  (1.651 m)   Wt (!) 403 lb (182.8 kg)   SpO2 97%   BMI 67.06 kg/m   Physical Exam Vitals reviewed.  Constitutional:      Appearance: Normal appearance.  HENT:     Head: Normocephalic.  Eyes:     Conjunctiva/sclera: Conjunctivae normal.     Pupils: Pupils are equal, round, and reactive to light.  Cardiovascular:     Rate and Rhythm: Normal rate and regular rhythm.     Pulses: Normal pulses.     Heart sounds: Normal heart sounds.  Pulmonary:     Effort: Pulmonary effort is normal.     Breath sounds: Normal breath sounds.  Abdominal:     Palpations: Abdomen is soft.     Tenderness: There is no abdominal tenderness.  Musculoskeletal:        General: Normal range of motion.     Cervical back: Normal range of motion and neck supple.  Skin:    General: Skin is warm and dry.  Neurological:     General: No focal deficit present.     Mental Status: She is alert and oriented to person, place, and time.  Psychiatric:        Mood and Affect: Mood normal.        Behavior: Behavior normal.     Assessment & Plan:   Problem List Items Addressed This Visit    None    Visit Diagnoses    Anxiety with depression    -  Primary   Relevant Medications   sertraline (ZOLOFT) 50 MG tablet   Other Relevant Orders   Ambulatory referral to Psychiatry   B12 and Folate Panel (Completed)   VITAMIN D 25  Hydroxy (Vit-D Deficiency, Fractures) (Completed)   Morbid obesity (HCC)       Relevant Orders   TSH (Completed)   Hemoglobin A1c (Completed)   Lipid panel (Completed)   Irritable bowel syndrome with diarrhea  Preventative health care       Relevant Orders   CBC with Differential/Platelet (Completed)   Comprehensive metabolic panel (Completed)   Hepatitis C antibody   HIV Antibody (routine testing w rflx)      Outpatient Encounter Medications as of 10/21/2019  Medication Sig  . desogestrel-ethinyl estradiol (APRI) 0.15-30 MG-MCG tablet TAKE 1 TABLET BY MOUTH EVERY DAY  . Loratadine 10 MG CAPS Take by mouth. Reported on 09/16/2015  . sertraline (ZOLOFT) 50 MG tablet Take 1 tablet (50 mg total) by mouth daily.  . [DISCONTINUED] lisdexamfetamine (VYVANSE) 30 MG capsule Take 1 capsule (30 mg total) by mouth daily.  . [DISCONTINUED] sertraline (ZOLOFT) 100 MG tablet Take 2 tablets (200 mg total) by mouth daily.  . [DISCONTINUED] SPRINTEC 28 0.25-35 MG-MCG tablet Take 1 tablet by mouth daily.  . [DISCONTINUED] traZODone (DESYREL) 50 MG tablet Take 1 tablet (50 mg total) by mouth at bedtime.   No facility-administered encounter medications on file as of 10/21/2019.   Referral placed to psychiatry-Dr. Elna Breslow.  She prefers a female Therapist, sports. Zoloft: We discussed common side effects such as nausea, drowsiness and weight gain.  Also discussed rare but serious side effect of suicide ideation.  She is instructed to discontinue medication go directly to ED if this occurs.  Pt verbalizes understanding.  Plan follow up in 1 month to evaluate progress.   Routine labs today.   Please get your Covid vaccine- check with your pharmacy.  Follow-up: Return in about 4 weeks (around 11/18/2019).   This visit occurred during the SARS-CoV-2 public health emergency.  Safety protocols were in place, including screening questions prior to the visit, additional usage of staff PPE, and extensive cleaning of  exam room while observing appropriate contact time as indicated for disinfecting solutions.   Amedeo Kinsman, NP

## 2019-10-21 NOTE — Patient Instructions (Addendum)
Zoloft: We discussed common side effects such as nausea, drowsiness and weight gain.  Also discussed rare but serious side effect of suicide ideation.  She is instructed to discontinue medication go directly to ED if this occurs.  Pt verbalizes understanding.  Plan follow up in 1 month to evaluate progress.   Routine labs today.   Please get your Covid vaccine- check with your pharmacy.   Preventive Care 86-30 Years Old, Female Preventive care refers to visits with your health care provider and lifestyle choices that can promote health and wellness. This includes:  A yearly physical exam. This may also be called an annual well check.  Regular dental visits and eye exams.  Immunizations.  Screening for certain conditions.  Healthy lifestyle choices, such as eating a healthy diet, getting regular exercise, not using drugs or products that contain nicotine and tobacco, and limiting alcohol use. What can I expect for my preventive care visit? Physical exam Your health care provider will check your:  Height and weight. This may be used to calculate body mass index (BMI), which tells if you are at a healthy weight.  Heart rate and blood pressure.  Skin for abnormal spots. Counseling Your health care provider may ask you questions about your:  Alcohol, tobacco, and drug use.  Emotional well-being.  Home and relationship well-being.  Sexual activity.  Eating habits.  Work and work Statistician.  Method of birth control.  Menstrual cycle.  Pregnancy history. What immunizations do I need?  Influenza (flu) vaccine  This is recommended every year. Tetanus, diphtheria, and pertussis (Tdap) vaccine  You may need a Td booster every 10 years. Varicella (chickenpox) vaccine  You may need this if you have not been vaccinated. Human papillomavirus (HPV) vaccine  If recommended by your health care provider, you may need three doses over 6 months. Measles, mumps, and rubella  (MMR) vaccine  You may need at least one dose of MMR. You may also need a second dose. Meningococcal conjugate (MenACWY) vaccine  One dose is recommended if you are age 34-21 years and a first-year college student living in a residence hall, or if you have one of several medical conditions. You may also need additional booster doses. Pneumococcal conjugate (PCV13) vaccine  You may need this if you have certain conditions and were not previously vaccinated. Pneumococcal polysaccharide (PPSV23) vaccine  You may need one or two doses if you smoke cigarettes or if you have certain conditions. Hepatitis A vaccine  You may need this if you have certain conditions or if you travel or work in places where you may be exposed to hepatitis A. Hepatitis B vaccine  You may need this if you have certain conditions or if you travel or work in places where you may be exposed to hepatitis B. Haemophilus influenzae type b (Hib) vaccine  You may need this if you have certain conditions. You may receive vaccines as individual doses or as more than one vaccine together in one shot (combination vaccines). Talk with your health care provider about the risks and benefits of combination vaccines. What tests do I need?  Blood tests  Lipid and cholesterol levels. These may be checked every 5 years starting at age 50.  Hepatitis C test.  Hepatitis B test. Screening  Diabetes screening. This is done by checking your blood sugar (glucose) after you have not eaten for a while (fasting).  Sexually transmitted disease (STD) testing.  BRCA-related cancer screening. This may be done if you have  a family history of breast, ovarian, tubal, or peritoneal cancers.  Pelvic exam and Pap test. This may be done every 3 years starting at age 71. Starting at age 25, this may be done every 5 years if you have a Pap test in combination with an HPV test. Talk with your health care provider about your test results, treatment  options, and if necessary, the need for more tests. Follow these instructions at home: Eating and drinking   Eat a diet that includes fresh fruits and vegetables, whole grains, lean protein, and low-fat dairy.  Take vitamin and mineral supplements as recommended by your health care provider.  Do not drink alcohol if: ? Your health care provider tells you not to drink. ? You are pregnant, may be pregnant, or are planning to become pregnant.  If you drink alcohol: ? Limit how much you have to 0-1 drink a day. ? Be aware of how much alcohol is in your drink. In the [place] one drink equals one 12 oz bottle of beer (355 mL), one 5 oz glass of wine (148 mL), or one 1 oz glass of hard liquor (44 mL). Lifestyle  Take daily care of your teeth and gums.  Stay active. Exercise for at least 30 minutes on 5 or more days each week.  Do not use any products that contain nicotine or tobacco, such as cigarettes, e-cigarettes, and chewing tobacco. If you need help quitting, ask your health care provider.  If you are sexually active, practice safe sex. Use a condom or other form of birth control (contraception) in order to prevent pregnancy and STIs (sexually transmitted infections). If you plan to become pregnant, see your health care provider for a preconception visit. What's next?  Visit your health care provider once a year for a well check visit.  Ask your health care provider how often you should have your eyes and teeth checked.  Stay up to date on all vaccines. This information is not intended to replace advice given to you by your health care provider. Make sure you discuss any questions you have with your health care provider. Document Revised: 01/10/2018 Document Reviewed: 01/10/2018 Elsevier Patient Education  2020 [employer].

## 2019-10-22 ENCOUNTER — Other Ambulatory Visit (INDEPENDENT_AMBULATORY_CARE_PROVIDER_SITE_OTHER): Payer: BC Managed Care – PPO

## 2019-10-22 ENCOUNTER — Encounter: Payer: Self-pay | Admitting: Nurse Practitioner

## 2019-10-22 ENCOUNTER — Other Ambulatory Visit: Payer: Self-pay | Admitting: Nurse Practitioner

## 2019-10-22 DIAGNOSIS — F418 Other specified anxiety disorders: Secondary | ICD-10-CM | POA: Insufficient documentation

## 2019-10-22 DIAGNOSIS — Z0001 Encounter for general adult medical examination with abnormal findings: Secondary | ICD-10-CM | POA: Insufficient documentation

## 2019-10-22 DIAGNOSIS — D72829 Elevated white blood cell count, unspecified: Secondary | ICD-10-CM | POA: Diagnosis not present

## 2019-10-22 DIAGNOSIS — Z0000 Encounter for general adult medical examination without abnormal findings: Secondary | ICD-10-CM | POA: Insufficient documentation

## 2019-10-22 LAB — HIV ANTIBODY (ROUTINE TESTING W REFLEX): HIV 1&2 Ab, 4th Generation: NONREACTIVE

## 2019-10-22 LAB — HEPATITIS C ANTIBODY
Hepatitis C Ab: NONREACTIVE
SIGNAL TO CUT-OFF: 0.02 (ref <1.00)

## 2019-10-22 NOTE — Progress Notes (Signed)
  Future CBC ordered. Urine culture negative.

## 2019-10-23 LAB — URINE CULTURE
MICRO NUMBER:: 10571287
Result:: NO GROWTH
SPECIMEN QUALITY:: ADEQUATE

## 2019-10-24 ENCOUNTER — Telehealth: Payer: Self-pay | Admitting: Nurse Practitioner

## 2019-10-24 DIAGNOSIS — D72829 Elevated white blood cell count, unspecified: Secondary | ICD-10-CM

## 2019-10-24 LAB — URINALYSIS, ROUTINE W REFLEX MICROSCOPIC
Bilirubin Urine: NEGATIVE
Hgb urine dipstick: NEGATIVE
Ketones, ur: NEGATIVE
Nitrite: NEGATIVE
RBC / HPF: NONE SEEN (ref 0–?)
Specific Gravity, Urine: <=1.005 — AB (ref 1.000–1.030)
Total Protein, Urine: NEGATIVE
Urine Glucose: NEGATIVE
Urobilinogen, UA: 0.2 (ref 0.0–1.0)
pH: 6.5 (ref 5.0–8.0)

## 2019-10-24 NOTE — Telephone Encounter (Signed)
Lab results discussed with Aleyda.

## 2019-10-24 NOTE — Telephone Encounter (Signed)
Her urine culture was negative.  She had a mild leukocytosis.   No fevers or chills.  She did have a little GI bug with diarrhea over the weekend that has resolved.  No residual GI concerns.  She has no upper respiratory complaints.  If no source for infection at this time.  Patient does have chronic anxiety, tachycardia is common.  She feels well.    Plan: Repeat CBC in the next 2 weeks at patient's convenience.  Please set her up for a lab appointment.

## 2019-10-24 NOTE — Telephone Encounter (Signed)
Patient scheduled for labs on 11/06/19 at 9am

## 2019-11-06 ENCOUNTER — Other Ambulatory Visit (INDEPENDENT_AMBULATORY_CARE_PROVIDER_SITE_OTHER): Payer: BC Managed Care – PPO

## 2019-11-06 ENCOUNTER — Other Ambulatory Visit: Payer: Self-pay | Admitting: Nurse Practitioner

## 2019-11-06 ENCOUNTER — Other Ambulatory Visit: Payer: Self-pay

## 2019-11-06 DIAGNOSIS — D72829 Elevated white blood cell count, unspecified: Secondary | ICD-10-CM

## 2019-11-07 LAB — CBC WITH DIFFERENTIAL
Basophils Absolute: 0 10*3/uL (ref 0.0–0.2)
Basos: 0 %
EOS (ABSOLUTE): 0.2 10*3/uL (ref 0.0–0.4)
Eos: 2 %
Hematocrit: 39.4 % (ref 34.0–46.6)
Hemoglobin: 12.8 g/dL (ref 11.1–15.9)
Immature Grans (Abs): 0 10*3/uL (ref 0.0–0.1)
Immature Granulocytes: 0 %
Lymphocytes Absolute: 2.1 10*3/uL (ref 0.7–3.1)
Lymphs: 19 %
MCH: 25.4 pg — ABNORMAL LOW (ref 26.6–33.0)
MCHC: 32.5 g/dL (ref 31.5–35.7)
MCV: 78 fL — ABNORMAL LOW (ref 79–97)
Monocytes Absolute: 0.8 10*3/uL (ref 0.1–0.9)
Monocytes: 8 %
Neutrophils Absolute: 7.9 10*3/uL — ABNORMAL HIGH (ref 1.4–7.0)
Neutrophils: 71 %
RBC: 5.03 x10E6/uL (ref 3.77–5.28)
RDW: 14.5 % (ref 11.7–15.4)
WBC: 11.1 10*3/uL — ABNORMAL HIGH (ref 3.4–10.8)

## 2019-11-13 ENCOUNTER — Other Ambulatory Visit: Payer: Self-pay | Admitting: Nurse Practitioner

## 2019-11-18 ENCOUNTER — Encounter: Payer: Self-pay | Admitting: Nurse Practitioner

## 2019-11-18 ENCOUNTER — Other Ambulatory Visit: Payer: Self-pay

## 2019-11-18 ENCOUNTER — Ambulatory Visit (INDEPENDENT_AMBULATORY_CARE_PROVIDER_SITE_OTHER): Payer: BC Managed Care – PPO | Admitting: Nurse Practitioner

## 2019-11-18 VITALS — BP 124/82 | HR 112 | Temp 97.6°F | Ht 65.0 in | Wt >= 6400 oz

## 2019-11-18 DIAGNOSIS — D72829 Elevated white blood cell count, unspecified: Secondary | ICD-10-CM | POA: Insufficient documentation

## 2019-11-18 DIAGNOSIS — F418 Other specified anxiety disorders: Secondary | ICD-10-CM

## 2019-11-18 DIAGNOSIS — R700 Elevated erythrocyte sedimentation rate: Secondary | ICD-10-CM | POA: Diagnosis not present

## 2019-11-18 LAB — C-REACTIVE PROTEIN: CRP: 3.1 mg/dL (ref 0.5–20.0)

## 2019-11-18 LAB — SEDIMENTATION RATE: Sed Rate: 33 mm/hr — ABNORMAL HIGH (ref 0–20)

## 2019-11-18 NOTE — Progress Notes (Signed)
Established Patient Office Visit  Subjective:  Patient ID: Nancy Molina, female    DOB: Oct 22, 1990  Age: 29 y.o. MRN: [id number]  CC:  Chief Complaint  Patient presents with  . Follow-up    HPI Nancy Molina is a 29 yo who presents for follow up of depression/anxiety on new start sertraline.   She presents on sertraline 50 mg daily. She has noted in the last 2 weeks less anxiety levels and is more motivated during the day.  She has had no GI complaints with this.  No fatigue.  She is taking the medication in the morning.  She has been appointment with psychiatry and stopped for a virtual visit on Saturday.  Today: PHQ 9: 6 and GAD-7 is 11- both scores improved.   Slight leukocytosis: She has no signs of infection and has been checking her temperature at home with temperature 97 range.  No fevers.  She has no chills, night sweats, joint pain.  Her mother notes RA and lupus on maternal side.  The patient wants checked for these problems.  Morbid obesity/BMI 66.90/Pre diabetes: Not at goal.  Walking daily with daughter more. Trying to eat less carbs and monitor portions.    Wt Readings from Last 3 Encounters:  11/18/19 (!) 402 lb (182.3 kg)  10/21/19 (!) 403 lb (182.8 kg)   Lab Results  Component Value Date   HGBA1C 6.2 10/21/2019    Chronic headaches: She reported lifelong headaches, but they have lessened as she has gotten older.  Two different kinds- 1. Tension HA that she gets 1-2 per week described as stress related and feels tension in the middle of her forehead, relieved with 1 or 2 Tylenol.  2. She gets a rarer HA - every few months,  in the back of the head left side- and she thinks this is from clenching her jaw in her sleep- better on sertraline. The last time she had to lay down in a dark room was > 1 year ago. She is not concerned about her HA and declines any Neurology w/up. No HA now.   Past Medical History:  Diagnosis Date  . Anxiety   . IBS (irritable bowel  syndrome)   . Obesity   . Obsessive-compulsive disorder     Past Surgical History:  Procedure Laterality Date  . DILATION AND CURETTAGE OF UTERUS    . TONSILLECTOMY AND ADENOIDECTOMY      Family History  Problem Relation Age of Onset  . Obesity Father   . Heart disease Father   . Alcohol abuse Father   . Depression Father   . Polycystic ovary syndrome Sister     Social History   Socioeconomic History  . Marital status: Married    Spouse name: Not on file  . Number of children: Not on file  . Years of education: Not on file  . Highest education level: Not on file  Occupational History  . Not on file  Tobacco Use  . Smoking status: Never Smoker  . Smokeless tobacco: Never Used  Substance and Sexual Activity  . Alcohol use: No    Alcohol/week: 0.0 standard drinks  . Drug use: No  . Sexual activity: Yes    Birth control/protection: Pill  Other Topics Concern  . Not on file  Social History Narrative  . Not on file   Social Determinants of Health   Financial Resource Strain:   . Difficulty of Paying Living Expenses:   Food Insecurity:   .  Worried About Charity fundraiser in the Last Year:   . Arboriculturist in the Last Year:   Transportation Needs:   . Film/video editor (Medical):   Marland Kitchen Lack of Transportation (Non-Medical):   Physical Activity:   . Days of Exercise per Week:   . Minutes of Exercise per Session:   Stress:   . Feeling of Stress :   Social Connections:   . Frequency of Communication with Friends and Family:   . Frequency of Social Gatherings with Friends and Family:   . Attends Religious Services:   . Active Member of Clubs or Organizations:   . Attends Archivist Meetings:   Marland Kitchen Marital Status:   Intimate Partner Violence:   . Fear of Current or Ex-Partner:   . Emotionally Abused:   Marland Kitchen Physically Abused:   . Sexually Abused:     Outpatient Medications Prior to Visit  Medication Sig Dispense Refill  . desogestrel-ethinyl  estradiol (APRI) 0.15-30 MG-MCG tablet TAKE 1 TABLET BY MOUTH EVERY DAY    . Loratadine 10 MG CAPS Take by mouth. Reported on 09/16/2015    . sertraline (ZOLOFT) 50 MG tablet TAKE 1 TABLET BY MOUTH EVERY DAY 90 tablet 2   No facility-administered medications prior to visit.    Allergies  Allergen Reactions  . Clindamycin/Lincomycin Shortness Of Breath  . Methocarbamol Shortness Of Breath  . Clindamycin Swelling    Review of Systems  Constitutional: Negative for activity change, appetite change, chills, diaphoresis, fatigue, fever and unexpected weight change.  HENT: Negative.   Eyes: Negative.   Respiratory: Negative.   Cardiovascular: Negative.   Gastrointestinal: Negative.   Endocrine: Negative.   Genitourinary: Negative.   Musculoskeletal: Negative.   Allergic/Immunologic: Negative.   Neurological: Negative.   Hematological: Negative.   Psychiatric/Behavioral:       Improved on Zoloft. No SI/HI.       Objective:    Physical Exam Vitals reviewed.  Constitutional:      Appearance: Normal appearance. She is obese.  HENT:     Head: Normocephalic and atraumatic.  Eyes:     Pupils: Pupils are equal, round, and reactive to light.  Cardiovascular:     Rate and Rhythm: Normal rate.     Pulses: Normal pulses.     Heart sounds: Normal heart sounds.  Pulmonary:     Effort: Pulmonary effort is normal.     Breath sounds: Normal breath sounds.  Abdominal:     Palpations: Abdomen is soft.     Tenderness: There is no abdominal tenderness.  Musculoskeletal:        General: Normal range of motion.     Cervical back: Normal range of motion and neck supple.  Skin:    General: Skin is warm and dry.  Neurological:     General: No focal deficit present.     Mental Status: She is alert and oriented to person, place, and time.  Psychiatric:        Mood and Affect: Mood normal.        Behavior: Behavior normal.        Thought Content: Thought content normal.        Judgment:  Judgment normal.     BP 124/82 (BP Location: Left Arm, Patient Position: Sitting, Cuff Size: Large)   Pulse (!) 112   Temp 97.6 F (36.4 C) (Oral)   Ht 5' 5"  (1.651 m)   Wt (!) 402 lb (182.3 kg)  SpO2 97%   BMI 66.90 kg/m  Wt Readings from Last 3 Encounters:  11/18/19 (!) 402 lb (182.3 kg)  10/21/19 (!) 403 lb (182.8 kg)     Health Maintenance Due  Topic Date Due  . COVID-19 Vaccine (1) Never done  . TETANUS/TDAP  Never done  . PAP SMEAR-Modifier  Never done    There are no preventive care reminders to display for this patient.  Lab Results  Component Value Date   TSH 1.57 10/21/2019   Lab Results  Component Value Date   WBC 11.1 (H) 11/06/2019   HGB 12.8 11/06/2019   HCT 39.4 11/06/2019   MCV 78 (L) 11/06/2019   PLT 353.0 10/21/2019   Lab Results  Component Value Date   NA 137 10/21/2019   K 4.1 10/21/2019   CO2 26 10/21/2019   GLUCOSE 99 10/21/2019   BUN 10 10/21/2019   CREATININE 0.62 10/21/2019   BILITOT 0.4 10/21/2019   ALKPHOS 94 10/21/2019   AST 12 10/21/2019   ALT 15 10/21/2019   PROT 7.2 10/21/2019   ALBUMIN 4.3 10/21/2019   CALCIUM 9.6 10/21/2019   ANIONGAP 9 06/29/2012   GFR 112.82 10/21/2019   Lab Results  Component Value Date   CHOL 192 10/21/2019   Lab Results  Component Value Date   HDL 46.10 10/21/2019   Lab Results  Component Value Date   LDLCALC 118 (H) 10/21/2019   Lab Results  Component Value Date   TRIG 137.0 10/21/2019   Lab Results  Component Value Date   CHOLHDL 4 10/21/2019   Lab Results  Component Value Date   HGBA1C 6.2 10/21/2019      Assessment & Plan:   Problem List Items Addressed This Visit      Other   Anxiety with depression - Primary   Morbid obesity ([place])   Leukocytosis   Relevant Orders   Sedimentation rate   C-reactive protein   Ambulatory referral to Hematology     Labs today to check inflammatory markers. We reviewed her lab results together. Her ESR is elevated. No joint pain,  arthritis, or HA. Will ask for RHEUM consult and opinion.  I placed a referral in to Hematology regarding slight persistent elevation in WBC. Per UTD obesity can cause mild leukocytosis.   Continue working on diet exercise and healthy diet for weight loss goals.  Declines formal Nutrition consult or referral to Weight Management team. Counseling provided on low carb, high protein, fiber diet food choices and given handouts.  Continue with Zoloft 50 mg daily.  Follow-up office visit in 3 months.  If we need to increase the dose she will let me know.  No orders of the defined types were placed in this encounter.   Follow-up: Return in about 3 months (around 02/18/2020).  This visit occurred during the SARS-CoV-2 public health emergency.  Safety protocols were in place, including screening questions prior to the visit, additional usage of staff PPE, and extensive cleaning of exam room while observing appropriate contact time as indicated for disinfecting solutions.    Denice Paradise, NP

## 2019-11-18 NOTE — Patient Instructions (Addendum)
Labs today to check inflammatory markers. We reviewed your  lab results.   I placed a referral in to  Hematology regarding slight persistent elevation in WBC.  Continue working on diet exercise and healthy diet for weight loss goals.   Continue with Zoloft 50 mg daily.  Follow-up office visit in 3 months.  If we need to increase the dose let me know.

## 2019-11-19 ENCOUNTER — Encounter: Payer: Self-pay | Admitting: Nurse Practitioner

## 2019-11-22 ENCOUNTER — Other Ambulatory Visit: Payer: Self-pay

## 2019-11-22 ENCOUNTER — Encounter (HOSPITAL_COMMUNITY): Payer: Self-pay | Admitting: Psychiatry

## 2019-11-22 ENCOUNTER — Telehealth (INDEPENDENT_AMBULATORY_CARE_PROVIDER_SITE_OTHER): Payer: BC Managed Care – PPO | Admitting: Psychiatry

## 2019-11-22 DIAGNOSIS — F330 Major depressive disorder, recurrent, mild: Secondary | ICD-10-CM

## 2019-11-22 DIAGNOSIS — F422 Mixed obsessional thoughts and acts: Secondary | ICD-10-CM

## 2019-11-22 MED ORDER — SERTRALINE HCL 100 MG PO TABS: 200.0000 mg | ORAL_TABLET | Freq: Every day | ORAL | 2 refills | Status: DC
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
## 2019-11-26 ENCOUNTER — Other Ambulatory Visit: Payer: Self-pay

## 2019-11-26 ENCOUNTER — Inpatient Hospital Stay: Payer: BC Managed Care – PPO

## 2019-11-26 ENCOUNTER — Inpatient Hospital Stay: Payer: BC Managed Care – PPO | Attending: Oncology | Admitting: Oncology

## 2019-11-26 ENCOUNTER — Encounter: Payer: Self-pay | Admitting: Oncology

## 2019-11-26 VITALS — BP 143/93 | HR 92 | Temp 97.6°F | Resp 16 | Wt >= 6400 oz

## 2019-11-26 DIAGNOSIS — R718 Other abnormality of red blood cells: Secondary | ICD-10-CM | POA: Diagnosis not present

## 2019-11-26 DIAGNOSIS — D72829 Elevated white blood cell count, unspecified: Secondary | ICD-10-CM | POA: Diagnosis present

## 2019-11-26 DIAGNOSIS — Z79899 Other long term (current) drug therapy: Secondary | ICD-10-CM | POA: Diagnosis not present

## 2019-11-26 DIAGNOSIS — F429 Obsessive-compulsive disorder, unspecified: Secondary | ICD-10-CM | POA: Insufficient documentation

## 2019-11-26 DIAGNOSIS — F418 Other specified anxiety disorders: Secondary | ICD-10-CM | POA: Insufficient documentation

## 2019-11-26 DIAGNOSIS — Z800 Family history of malignant neoplasm of digestive organs: Secondary | ICD-10-CM | POA: Diagnosis not present

## 2019-11-26 DIAGNOSIS — M2550 Pain in unspecified joint: Secondary | ICD-10-CM | POA: Insufficient documentation

## 2019-11-26 DIAGNOSIS — K589 Irritable bowel syndrome without diarrhea: Secondary | ICD-10-CM | POA: Insufficient documentation

## 2019-11-26 DIAGNOSIS — E669 Obesity, unspecified: Secondary | ICD-10-CM | POA: Diagnosis not present

## 2019-11-26 LAB — CBC WITH DIFFERENTIAL/PLATELET
Abs Immature Granulocytes: 0.05 10*3/uL (ref 0.00–0.07)
Basophils Absolute: 0 10*3/uL (ref 0.0–0.1)
Basophils Relative: 0 %
Eosinophils Absolute: 0.2 10*3/uL (ref 0.0–0.5)
Eosinophils Relative: 1 %
HCT: 39.5 % (ref 36.0–46.0)
Hemoglobin: 13.1 g/dL (ref 12.0–15.0)
Immature Granulocytes: 0 %
Lymphocytes Relative: 13 %
Lymphs Abs: 1.6 10*3/uL (ref 0.7–4.0)
MCH: 25.5 pg — ABNORMAL LOW (ref 26.0–34.0)
MCHC: 33.2 g/dL (ref 30.0–36.0)
MCV: 77 fL — ABNORMAL LOW (ref 80.0–100.0)
Monocytes Absolute: 0.8 10*3/uL (ref 0.1–1.0)
Monocytes Relative: 6 %
Neutro Abs: 9.6 10*3/uL — ABNORMAL HIGH (ref 1.7–7.7)
Neutrophils Relative %: 80 %
Platelets: 371 10*3/uL (ref 150–400)
RBC: 5.13 MIL/uL — ABNORMAL HIGH (ref 3.87–5.11)
RDW: 14.4 % (ref 11.5–15.5)
WBC: 12.2 10*3/uL — ABNORMAL HIGH (ref 4.0–10.5)
nRBC: 0 % (ref 0.0–0.2)

## 2019-11-26 LAB — TECHNOLOGIST SMEAR REVIEW: Plt Morphology: NORMAL

## 2019-11-26 LAB — HEPATITIS PANEL, ACUTE
HCV Ab: NONREACTIVE
Hep A IgM: NONREACTIVE
Hep B C IgM: NONREACTIVE
Hepatitis B Surface Ag: NONREACTIVE

## 2019-11-26 LAB — RETIC PANEL
Immature Retic Fract: 15.5 % (ref 2.3–15.9)
RBC.: 5.17 MIL/uL — ABNORMAL HIGH (ref 3.87–5.11)
Retic Count, Absolute: 73.4 10*3/uL (ref 19.0–186.0)
Retic Ct Pct: 1.4 % (ref 0.4–3.1)
Reticulocyte Hemoglobin: 29.7 pg (ref >27.9)

## 2019-11-26 LAB — IRON AND TIBC
Iron: 36 ug/dL (ref 28–170)
Saturation Ratios: 10 % — ABNORMAL LOW (ref 10.4–31.8)
TIBC: 356 ug/dL (ref 250–450)
UIBC: 320 ug/dL

## 2019-11-26 LAB — FERRITIN: Ferritin: 45 ng/mL (ref 11–307)

## 2019-11-26 NOTE — Progress Notes (Signed)
New patient evaluation for increased WBC.

## 2019-11-26 NOTE — Progress Notes (Signed)
Hematology/Oncology Consult note [facility] [phone] [phone]   Patient Care Team: Marval Regal, NP as PCP - General (Nurse Practitioner)  REFERRING PROVIDER: Marval Regal, NP  CHIEF COMPLAINTS/REASON FOR VISIT:  Evaluation of leukocytosis  HISTORY OF PRESENTING ILLNESS:  Nancy Molina is a  29 y.o.  female with PMH listed below who was referred to me for evaluation of leukocytosis Reviewed patient' recent labs obtained by PCP.   CBC showed elevated white count of 11/06/2019 11.1, hemoglobin 12.8, MCV 78, Previous lab records reviewed. Leukocytosis onset of chronic, duration is since at least 2019. 03/25/2018 ESR 39, CRP 30 11/18/2019 CRP 3.1, ESR 33, denies any morning stiffness.  She has chronic bilateral lower extremity joint pain.  Associated symptoms or signs:  Denies weight loss, fever, chills,night sweats, fatigue  smoking history: Never smoker History of recent oral steroid use or steroid injection: Denies any use History of recent infection: Denies Autoimmune disease history.  Denies.  Patient has been referred to establish care with rheumatology she has an appointment in November 2021.  Patient has history of IBS. Takes Zoloft for OCD.  Also on birth control pills.  Review of Systems  Constitutional: Negative for appetite change, chills, fatigue and fever.  HENT:   Negative for hearing loss and voice change.   Eyes: Negative for eye problems.  Respiratory: Negative for chest tightness and cough.   Cardiovascular: Negative for chest pain.  Gastrointestinal: Negative for abdominal distention, abdominal pain and blood in stool.  Endocrine: Negative for hot flashes.  Genitourinary: Negative for difficulty urinating and frequency.   Musculoskeletal: Positive for arthralgias.  Skin: Negative for itching and rash.  Neurological: Negative for extremity weakness.  Hematological: Negative for adenopathy.   Psychiatric/Behavioral: Negative for confusion.     MEDICAL HISTORY:  Past Medical History:  Diagnosis Date  . Anxiety   . Depression   . IBS (irritable bowel syndrome)   . Obesity   . Obsessive-compulsive disorder   . OCD (obsessive compulsive disorder)     SURGICAL HISTORY: Past Surgical History:  Procedure Laterality Date  . DILATION AND CURETTAGE OF UTERUS    . TONSILLECTOMY AND ADENOIDECTOMY      SOCIAL HISTORY: Social History   Socioeconomic History  . Marital status: Married    Spouse name: Not on file  . Number of children: 1  . Years of education: Not on file  . Highest education level: Not on file  Occupational History  . Occupation: homemaker  Tobacco Use  . Smoking status: Never Smoker  . Smokeless tobacco: Never Used  Vaping Use  . Vaping Use: Never used  Substance and Sexual Activity  . Alcohol use: No    Alcohol/week: 0.0 standard drinks  . Drug use: No  . Sexual activity: Yes    Birth control/protection: Pill  Other Topics Concern  . Not on file  Social History Narrative   Born in [place], later moved to [place]. She lives with husband and 80 yo daughter Nancy Molina.   Two older sisters.   Social Determinants of Health   Financial Resource Strain:   . Difficulty of Paying Living Expenses:   Food Insecurity:   . Worried About Charity fundraiser in the Last Year:   . Arboriculturist in the Last Year:   Transportation Needs:   . Film/video editor (Medical):   Marland Kitchen Lack of Transportation (Non-Medical):   Physical Activity:   . Days of Exercise per Week:   .  Minutes of Exercise per Session:   Stress:   . Feeling of Stress :   Social Connections:   . Frequency of Communication with Friends and Family:   . Frequency of Social Gatherings with Friends and Family:   . Attends Religious Services:   . Active Member of Clubs or Organizations:   . Attends Archivist Meetings:   Marland Kitchen Marital Status:   Intimate Partner Violence:   . Fear of  Current or Ex-Partner:   . Emotionally Abused:   Marland Kitchen Physically Abused:   . Sexually Abused:     FAMILY HISTORY: Family History  Problem Relation Age of Onset  . Depression Mother   . Anxiety disorder Mother   . Alcohol abuse Mother   . Eating disorder Mother   . Obesity Father   . Heart disease Father   . Alcohol abuse Father   . Depression Father   . Polycystic ovary syndrome Sister   . Alcohol abuse Maternal Grandmother   . Alcohol abuse Paternal Grandfather   . Depression Paternal Grandfather   . Colon cancer Maternal Grandfather     ALLERGIES:  is allergic to clindamycin/lincomycin, methocarbamol, and clindamycin.  MEDICATIONS:  Current Outpatient Medications  Medication Sig Dispense Refill  . desogestrel-ethinyl estradiol (APRI) 0.15-30 MG-MCG tablet TAKE 1 TABLET BY MOUTH EVERY DAY    . Loratadine 10 MG CAPS Take by mouth. Reported on 09/16/2015    . sertraline (ZOLOFT) 100 MG tablet Take 2 tablets (200 mg total) by mouth daily. 60 tablet 2   No current facility-administered medications for this visit.     PHYSICAL EXAMINATION: ECOG PERFORMANCE STATUS: 0 - Asymptomatic Vitals:   11/26/19 0931  BP: (!) 143/93  Pulse: 92  Resp: 16  Temp: 97.6 F (36.4 C)   Filed Weights   11/26/19 0931  Weight: (!) 401 lb 3.2 oz (182 kg)    Physical Exam Constitutional:      General: She is not in acute distress. HENT:     Head: Normocephalic and atraumatic.  Eyes:     General: No scleral icterus. Cardiovascular:     Rate and Rhythm: Normal rate and regular rhythm.     Heart sounds: Normal heart sounds.  Pulmonary:     Effort: Pulmonary effort is normal. No respiratory distress.     Breath sounds: No wheezing.  Abdominal:     General: Bowel sounds are normal. There is no distension.     Palpations: Abdomen is soft.  Musculoskeletal:        General: No deformity. Normal range of motion.     Cervical back: Normal range of motion and neck supple.  Skin:     General: Skin is warm and dry.     Findings: No erythema or rash.  Neurological:     Mental Status: She is alert and oriented to person, place, and time. Mental status is at baseline.     Cranial Nerves: No cranial nerve deficit.     Coordination: Coordination normal.  Psychiatric:        Mood and Affect: Mood normal.     CMP Latest Ref Rng & Units 10/21/2019  Glucose 70 - 99 mg/dL 99  BUN 6 - 23 mg/dL 10  Creatinine 0.40 - 1.20 mg/dL 0.62  Sodium 135 - 145 mEq/L 137  Potassium 3.5 - 5.1 mEq/L 4.1  Chloride 96 - 112 mEq/L 102  CO2 19 - 32 mEq/L 26  Calcium 8.4 - 10.5 mg/dL 9.6  Total Protein  6.0 - 8.3 g/dL 7.2  Total Bilirubin 0.2 - 1.2 mg/dL 0.4  Alkaline Phos 39 - 117 U/L 94  AST 0 - 37 U/L 12  ALT 0 - 35 U/L 15   CBC Latest Ref Rng & Units 11/26/2019  WBC 4.0 - 10.5 K/uL 12.2(H)  Hemoglobin 12.0 - 15.0 g/dL 13.1  Hematocrit 36 - 46 % 39.5  Platelets 150 - 400 K/uL 371     RADIOGRAPHIC STUDIES: I have personally reviewed the radiological images as listed and agreed with the findings in the report. No results found.  LABORATORY DATA:  I have reviewed the data as listed Lab Results  Component Value Date   WBC 12.2 (H) 11/26/2019   HGB 13.1 11/26/2019   HCT 39.5 11/26/2019   MCV 77.0 (L) 11/26/2019   PLT 371 11/26/2019   Recent Labs    10/21/19 1206  NA 137  K 4.1  CL 102  CO2 26  GLUCOSE 99  BUN 10  CREATININE 0.62  CALCIUM 9.6  PROT 7.2  ALBUMIN 4.3  AST 12  ALT 15  ALKPHOS 94  BILITOT 0.4   Iron/TIBC/Ferritin/ %Sat No results found for: IRON, TIBC, FERRITIN, IRONPCTSAT      ASSESSMENT & PLAN:  1. Leukocytosis, unspecified type   2. RBC microcytosis    Labs reviewed and discussed with patient that Leukocytosis, predominantly neutrophilia, can be secondary to infection, chronic inflammation, smoking, autoimmune disease, or underlying bone marrow disorders.   For the work up of patient's leukocytosis, I recommend checking CBC;CMP, LDH, smear  review, flowcytometry, HIV, hepatitis, ANA with reflex etc. Microcytosis, no anemia.  Check iron panel.  Reticulocyte panel   Orders Placed This Encounter  Procedures  . CBC with Differential/Platelet    Standing Status:   Future    Number of Occurrences:   1    Standing Expiration Date:   11/25/2020  . Technologist smear review    Standing Status:   Future    Number of Occurrences:   1    Standing Expiration Date:   11/25/2020  . Flow cytometry panel-leukemia/lymphoma work-up  . HIV Antibody (routine testing w rflx)  . Hepatitis panel, acute    Standing Status:   Future    Number of Occurrences:   1    Standing Expiration Date:   11/25/2020  . ANA w/Reflex    Standing Status:   Future    Number of Occurrences:   1    Standing Expiration Date:   11/25/2020    All questions were answered. The patient knows to call the clinic with any problems questions or concerns.  Return of visit: To be determined Thank you for this kind referral and the opportunity to participate in the care of this patient. A copy of today's note is routed to referring provider   Earlie Server, MD, PhD Hematology Oncology [facility] Pager- [phone] 11/26/2019

## 2019-11-27 LAB — ANA W/REFLEX: Anti Nuclear Antibody (ANA): NEGATIVE

## 2019-11-29 ENCOUNTER — Other Ambulatory Visit: Payer: Self-pay | Admitting: Oncology

## 2019-11-29 MED ORDER — IRON-VITAMIN C 65-125 MG PO TABS: 1.0000 | ORAL_TABLET | Freq: Every day | ORAL | 1 refills | Status: AC

## 2019-12-01 ENCOUNTER — Telehealth: Payer: Self-pay

## 2019-12-01 DIAGNOSIS — D72829 Elevated white blood cell count, unspecified: Secondary | ICD-10-CM

## 2019-12-01 NOTE — Telephone Encounter (Signed)
Patient notified of results and rx.  Please schedule her for 6 months lab (prior)/MD and inform pt of appt detail.

## 2019-12-01 NOTE — Telephone Encounter (Signed)
Done...  Pt has been scheduled as requested. Pt is aware. 

## 2019-12-01 NOTE — Telephone Encounter (Signed)
-----   Message from Rickard Patience, MD sent at 11/29/2019 10:57 AM EDT ----- Please let pt know that she has iron deficiency, recommend vitron c dialy. Rx sent to pharmacy.  Please arrange her to follow up in 6 months. Lab cbc iron tibc ferritin prior. MD thanks.

## 2019-12-02 ENCOUNTER — Encounter: Payer: Self-pay | Admitting: Nurse Practitioner

## 2019-12-02 ENCOUNTER — Ambulatory Visit (INDEPENDENT_AMBULATORY_CARE_PROVIDER_SITE_OTHER): Payer: BC Managed Care – PPO | Admitting: Nurse Practitioner

## 2019-12-02 ENCOUNTER — Other Ambulatory Visit: Payer: Self-pay

## 2019-12-02 VITALS — BP 124/78 | HR 106 | Temp 97.7°F | Ht 65.0 in | Wt >= 6400 oz

## 2019-12-02 DIAGNOSIS — K429 Umbilical hernia without obstruction or gangrene: Secondary | ICD-10-CM | POA: Insufficient documentation

## 2019-12-02 DIAGNOSIS — R1033 Periumbilical pain: Secondary | ICD-10-CM | POA: Diagnosis not present

## 2019-12-02 DIAGNOSIS — N200 Calculus of kidney: Secondary | ICD-10-CM

## 2019-12-02 NOTE — Patient Instructions (Addendum)
Will obtain  CT of the abdomen pelvis to evaluate the possibility of umbilical hernia.  You have tenderness around the bellybutton area.   I placed referral into general surgeon to evaluate as well. Follow-up office visit in 1 week.  Please do no heavy lifting in the meantime.    Contact a health care provider if:  Your hernia gets larger.  Your hernia becomes painful. Get help right away if:  You develop sudden, severe pain near the area of your hernia.  You have pain as well as nausea or vomiting.  You have pain and the skin over your hernia changes color.  You develop a fever.  Umbilical Hernia, Adult  A hernia is a bulge of tissue that pushes through an opening between muscles. An umbilical hernia happens in the abdomen, near the belly button (umbilicus). The hernia may contain tissues from the small intestine, large intestine, or fatty tissue covering the intestines (omentum). Umbilical hernias in adults tend to get worse over time, and they require surgical treatment. There are several types of umbilical hernias. You may have:  A hernia located just above or below the umbilicus (indirect hernia). This is the most common type of umbilical hernia in adults.  A hernia that forms through an opening formed by the umbilicus (direct hernia).  A hernia that comes and goes (reducible hernia). A reducible hernia may be visible only when you strain, lift something heavy, or cough. This type of hernia can be pushed back into the abdomen (reduced).  A hernia that traps abdominal tissue inside the hernia (incarcerated hernia). This type of hernia cannot be reduced.  A hernia that cuts off blood flow to the tissues inside the hernia (strangulated hernia). The tissues can start to die if this happens. This type of hernia requires emergency treatment. What are the causes? An umbilical hernia happens when tissue inside the abdomen presses on a weak area of the abdominal muscles. What  increases the risk? You may have a greater risk of this condition if you:  Are obese.  Have had several pregnancies.  Have a buildup of fluid inside your abdomen (ascites).  Have had surgery that weakens the abdominal muscles. What are the signs or symptoms? The main symptom of this condition is a painless bulge at or near the belly button. A reducible hernia may be visible only when you strain, lift something heavy, or cough. Other symptoms may include:  Dull pain.  A feeling of pressure. Symptoms of a strangulated hernia may include:  Pain that gets increasingly worse.  Nausea and vomiting.  Pain when pressing on the hernia.  Skin over the hernia becoming red or purple.  Constipation.  Blood in the stool. How is this diagnosed? This condition may be diagnosed based on:  A physical exam. You may be asked to cough or strain while standing. These actions increase the pressure inside your abdomen and force the hernia through the opening in your muscles. Your health care provider may try to reduce the hernia by pressing on it.  Your symptoms and medical history. How is this treated? Surgery is the only treatment for an umbilical hernia. Surgery for a strangulated hernia is done as soon as possible. If you have a small hernia that is not incarcerated, you may need to lose weight before having surgery. Follow these instructions at home:  Lose weight, if told by your health care provider.  Do not try to push the hernia back in.  Watch your hernia  for any changes in color or size. Tell your health care provider if any changes occur.  You may need to avoid activities that increase pressure on your hernia.  Do not lift anything that is heavier than 10 lb (4.5 kg) until your health care provider says that this is safe.  Take over-the-counter and prescription medicines only as told by your health care provider.  Keep all follow-up visits as told by your health care provider.  This is important. Contact a health care provider if:  Your hernia gets larger.  Your hernia becomes painful. Get help right away if:  You develop sudden, severe pain near the area of your hernia.  You have pain as well as nausea or vomiting.  You have pain and the skin over your hernia changes color.  You develop a fever. This information is not intended to replace advice given to you by your health care provider. Make sure you discuss any questions you have with your health care provider. Document Revised: 06/13/2017 Document Reviewed: 10/30/2016 Elsevier Patient Education  2020 [employer].

## 2019-12-02 NOTE — Progress Notes (Signed)
Established Patient Office Visit  Subjective:  Patient ID: Nancy Molina, female    DOB: [birth year]  Age: 29 y.o. MRN: [id number]  CC:  Chief Complaint  Patient presents with  . Acute Visit    belly button pain    HPI Danniel B Shirer presents for concerns about umbilical hernia.  Last week Thurs she lifted her 51 yo niece out of a low pack and play. She has had daily  pain around umbilicus and it wakes her from sleep. She cannot tolerate any pressure on the area. It hurts to cough/sneeze. She feels a hard lump when standing and resolves when sitting. No hx or hernia.   Hematology: IDA to start supplement and recheck w Hematol in 6 mos.  Anxiety/depression: Doing well on Zoloft 50 mg and is back up to Zoloft 200 mg because of severity of OCD. She is doing well but will  monitor to make sure she does not feel emotionless. Seeing Psychiatry Dr. Hinton Dyer    HA: Resolved when not as anxious and clenching her teeth. Past Medical History:  Diagnosis Date  . Anxiety   . Depression   . IBS (irritable bowel syndrome)   . Obesity   . Obsessive-compulsive disorder   . OCD (obsessive compulsive disorder)     Past Surgical History:  Procedure Laterality Date  . DILATION AND CURETTAGE OF UTERUS    . TONSILLECTOMY AND ADENOIDECTOMY      Family History  Problem Relation Age of Onset  . Depression Mother   . Anxiety disorder Mother   . Alcohol abuse Mother   . Eating disorder Mother   . Obesity Father   . Heart disease Father   . Alcohol abuse Father   . Depression Father   . Polycystic ovary syndrome Sister   . Alcohol abuse Maternal Grandmother   . Alcohol abuse Paternal Grandfather   . Depression Paternal Grandfather   . Colon cancer Maternal Grandfather     Social History   Socioeconomic History  . Marital status: Married    Spouse name: Not on file  . Number of children: 1  . Years of education: Not on file  . Highest education level: Not on file    Occupational History  . Occupation: homemaker  Tobacco Use  . Smoking status: Never Smoker  . Smokeless tobacco: Never Used  Vaping Use  . Vaping Use: Never used  Substance and Sexual Activity  . Alcohol use: No    Alcohol/week: 0.0 standard drinks  . Drug use: No  . Sexual activity: Yes    Birth control/protection: Pill  Other Topics Concern  . Not on file  Social History Narrative   Born in [place], [place]. She lives with husband and 75 yo daughter [name].   Two older sisters.   Social Determinants of Health   Financial Resource Strain:   . Difficulty of Paying Living Expenses:   Food Insecurity:   . Worried About Programme researcher, broadcasting/film/video in the Last Year:   . Barista in the Last Year:   Transportation Needs:   . Freight forwarder (Medical):   Marland Kitchen Lack of Transportation (Non-Medical):   Physical Activity:   . Days of Exercise per Week:   . Minutes of Exercise per Session:   Stress:   . Feeling of Stress :   Social Connections:   . Frequency of Communication with Friends and Family:   . Frequency of Social Gatherings with Friends  and Family:   . Attends Religious Services:   . Active Member of Clubs or Organizations:   . Attends Banker Meetings:   Marland Kitchen Marital Status:   Intimate Partner Violence:   . Fear of Current or Ex-Partner:   . Emotionally Abused:   Marland Kitchen Physically Abused:   . Sexually Abused:     Outpatient Medications Prior to Visit  Medication Sig Dispense Refill  . desogestrel-ethinyl estradiol (APRI) 0.15-30 MG-MCG tablet TAKE 1 TABLET BY MOUTH EVERY DAY    . Iron-Vitamin C 65-125 MG TABS Take 1 tablet by mouth daily. 90 tablet 1  . Loratadine 10 MG CAPS Take by mouth. Reported on 09/16/2015    . sertraline (ZOLOFT) 100 MG tablet Take 2 tablets (200 mg total) by mouth daily. 60 tablet 2   No facility-administered medications prior to visit.    Allergies  Allergen Reactions  . Clindamycin/Lincomycin Shortness Of Breath   . Methocarbamol Shortness Of Breath  . Clindamycin Swelling   Review of Systems Pertinent positives none history of present illness otherwise negative.   Objective:    Physical Exam Vitals reviewed.  Constitutional:      Appearance: Normal appearance. She is obese.  Cardiovascular:     Rate and Rhythm: Normal rate and regular rhythm.     Heart sounds: Normal heart sounds.  Pulmonary:     Effort: Pulmonary effort is normal.     Breath sounds: Normal breath sounds.  Abdominal:     General: There is no distension.     Palpations: Abdomen is soft. There is no mass.     Tenderness: There is abdominal tenderness. There is no guarding or rebound.     Hernia: A hernia is present.     Comments: Tender at umbilicus  itself, no visible bulge limited by body habitus. A small bulge is present inside the umbilicus with standing as well as supine.  Rest of the abdomen is nontender.  No skin color changes to the umbilicus.  No guarding, rebound, and does not appear to be a surgical abdomen.  Musculoskeletal:        General: Normal range of motion.  Skin:    General: Skin is warm and dry.  Neurological:     General: No focal deficit present.     Mental Status: She is alert and oriented to person, place, and time.  Psychiatric:        Mood and Affect: Mood normal.        Behavior: Behavior normal.     BP 124/78 (BP Location: Left Arm, Patient Position: Sitting, Cuff Size: Large)   Pulse (!) 106   Temp 97.7 F (36.5 C) (Oral)   Ht 5\' 5"  (1.651 m)   Wt (!) 401 lb (181.9 kg)   SpO2 98%   BMI 66.73 kg/m  Wt Readings from Last 3 Encounters:  12/02/19 (!) 401 lb (181.9 kg)  11/26/19 (!) 401 lb 3.2 oz (182 kg)  11/18/19 (!) 402 lb (182.3 kg)     Health Maintenance Due  Topic Date Due  . COVID-19 Vaccine (1) Never done  . TETANUS/TDAP  Never done  . PAP SMEAR-Modifier  Never done    There are no preventive care reminders to display for this patient.  Lab Results  Component  Value Date   TSH 1.57 10/21/2019   Lab Results  Component Value Date   WBC 12.2 (H) 11/26/2019   HGB 13.1 11/26/2019   HCT 39.5 11/26/2019   MCV  77.0 (L) 11/26/2019   PLT 371 11/26/2019   Lab Results  Component Value Date   NA 137 10/21/2019   K 4.1 10/21/2019   CO2 26 10/21/2019   GLUCOSE 99 10/21/2019   BUN 10 10/21/2019   CREATININE 0.62 10/21/2019   BILITOT 0.4 10/21/2019   ALKPHOS 94 10/21/2019   AST 12 10/21/2019   ALT 15 10/21/2019   PROT 7.2 10/21/2019   ALBUMIN 4.3 10/21/2019   CALCIUM 9.6 10/21/2019   ANIONGAP 9 06/29/2012   GFR 112.82 10/21/2019   Lab Results  Component Value Date   CHOL 192 10/21/2019   Lab Results  Component Value Date   HDL 46.10 10/21/2019   Lab Results  Component Value Date   LDLCALC 118 (H) 10/21/2019   Lab Results  Component Value Date   TRIG 137.0 10/21/2019   Lab Results  Component Value Date   CHOLHDL 4 10/21/2019   Lab Results  Component Value Date   HGBA1C 6.2 10/21/2019      Assessment & Plan:   Problem List Items Addressed This Visit      Other   Umbilical pain - Primary   Relevant Orders   Ambulatory referral to General Surgery   CT Abdomen Pelvis W Contrast      No orders of the defined types were placed in this encounter.  Will obtain  CT of the abdomen pelvis to evaluate the possibility of umbilical hernia.  You have tenderness around the bellybutton area.  LMP 7/3 on birth control- denies risk of pregnancy  I placed referral into general surgeon to evaluate as well. Follow-up office visit in 1 week.  Please do no heavy lifting in the meantime.    Contact a health care provider if:  Your hernia gets larger.  Your hernia becomes painful. Get help right away if:  You develop sudden, severe pain near the area of your hernia.  You have pain as well as nausea or vomiting.  You have pain and the skin over your hernia changes color.  You develop a fever.  Follow-up: Return in about 1  week (around 12/09/2019).   This visit occurred during the SARS-CoV-2 public health emergency.  Safety protocols were in place, including screening questions prior to the visit, additional usage of staff PPE, and extensive cleaning of exam room while observing appropriate contact time as indicated for disinfecting solutions.   Amedeo Kinsman, NP

## 2019-12-04 ENCOUNTER — Telehealth: Payer: Self-pay | Admitting: Nurse Practitioner

## 2019-12-04 ENCOUNTER — Ambulatory Visit
Admission: RE | Admit: 2019-12-04 | Discharge: 2019-12-04 | Disposition: A | Discharge status: Home / Self Care | Payer: BC Managed Care – PPO | Source: Ambulatory Visit | Attending: Nurse Practitioner | Admitting: Nurse Practitioner

## 2019-12-04 ENCOUNTER — Other Ambulatory Visit: Payer: Self-pay

## 2019-12-04 ENCOUNTER — Encounter: Payer: Self-pay | Admitting: Nurse Practitioner

## 2019-12-04 DIAGNOSIS — R1033 Periumbilical pain: Secondary | ICD-10-CM | POA: Diagnosis not present

## 2019-12-04 MED ORDER — IOHEXOL 300 MG/ML  SOLN
125.0000 mL | Freq: Once | INTRAMUSCULAR | Status: AC | PRN
  Administered 2019-12-04: 125 mL via INTRAVENOUS

## 2019-12-04 NOTE — Telephone Encounter (Signed)
Patient calling about CT exam; has not received a call yet to schedule

## 2019-12-04 NOTE — Telephone Encounter (Signed)
[facility] pre-service called about pt CT scan stated that order is wrong can call [phone] ext [phone]

## 2019-12-04 NOTE — Telephone Encounter (Signed)
Pt called and stated that she has not heard from radiology for CT yet

## 2019-12-05 NOTE — Addendum Note (Signed)
Addended by: Amedeo Kinsman A on: 12/05/2019 04:17 PM   Modules accepted: Orders

## 2019-12-09 ENCOUNTER — Ambulatory Visit: Payer: BC Managed Care – PPO | Admitting: Nurse Practitioner

## 2019-12-09 ENCOUNTER — Other Ambulatory Visit: Payer: Self-pay

## 2019-12-09 ENCOUNTER — Encounter: Payer: Self-pay | Admitting: Surgery

## 2019-12-09 ENCOUNTER — Telehealth: Payer: Self-pay | Admitting: Surgery

## 2019-12-09 ENCOUNTER — Telehealth: Payer: Self-pay | Admitting: Nurse Practitioner

## 2019-12-09 ENCOUNTER — Ambulatory Visit (INDEPENDENT_AMBULATORY_CARE_PROVIDER_SITE_OTHER): Payer: BC Managed Care – PPO | Admitting: Surgery

## 2019-12-09 VITALS — BP 145/87 | HR 121 | Temp 98.5°F | Resp 12 | Ht 64.0 in | Wt >= 6400 oz

## 2019-12-09 DIAGNOSIS — Z6841 Body Mass Index (BMI) 40.0 and over, adult: Secondary | ICD-10-CM

## 2019-12-09 NOTE — Telephone Encounter (Signed)
Received return call from the patient.  Per Dr. Claudine Mouton will hold off on surgery for now as patient may be at a higher risk with surgery due to weight.  She is in the process of focusing on possible weight loss surgery.  Patient is aware of this and voices understanding.

## 2019-12-09 NOTE — Telephone Encounter (Signed)
Outgoing call is made, left message for patient to call me so that we can discuss possible surgery dates.

## 2019-12-09 NOTE — Telephone Encounter (Signed)
LMOM for Nancy Molina to call me back to talk about the GEN surgery consult with Dr. Claudine Mouton about her small umbilical hernia.  Her surgeon believes that she would do much better losing weight first. Or, if she has bariatric surgery, the umbilicus hernia could be repaired then. Some of her umbilical hernia discomfort could be from diastasis recti. Her MO- BMI 68.83 is of greater risk than the hernia. I do agree with that assessment.   Since she established care in June, we have discussed her weight and she is walking daily, trying to eat less carbs, monitor portions.  She declines formal nutrition consult or referral to weight management team.  Counseling has been provided on low-carb, high-protein, high-fiber diet with few food choices and dietary recommended handouts.  New start sertraline titrated up to 200 mg daily and improved very high PHQ and GAD scores. She established care with Psychiatry on 11/22/19 and Dx with OCD and major depression.  Her chronic headaches have improved on sertraline.  She has a slight persistent leukocytosis, elevated sed rate and was seen by Dr. Cathie Hoops in hematology/oncology 11/26/2019.  She was diagnosed with iron deficiency recommended Vitron C daily. Hopefully, she will feel better-more energy.   She has resting tachycardia and intermittent elevated BP, no CP/SOB,  LDL 118, A1c 6.2 - pre diabetes. She has made good progress in her health since last month.  We have follow-up visit planned in 3 mos. Will bring up gastric bypass recommendation for future consideration as she was initially not interested in anything that aggressive.    Plan:  If she calls back during clinic, please let her know that I want to talk with her and I will call her when I can. Thank you.

## 2019-12-09 NOTE — Progress Notes (Signed)
Patient ID: Nancy Molina, female   DOB: 04-01-1991, 29 y.o.   MRN: [id number]  Chief Complaint: Umbilical hernia  History of Present Illness Nancy Molina is a 29 y.o. female with pain at her umbilicus, first initiated when bending over to lift up a small child.  Now exacerbated by getting out of bed, and bending over.  A mild crunch maneuver does not elicit pain.  She has no changes in bowel activity, urinary frequency.  She denies exacerbation with coughing or sneezing.  She reports nausea but denies vomiting.  Past Medical History Past Medical History:  Diagnosis Date  . Anxiety   . Depression   . IBS (irritable bowel syndrome)   . Obesity   . Obsessive-compulsive disorder   . OCD (obsessive compulsive disorder)       Past Surgical History:  Procedure Laterality Date  . DILATION AND CURETTAGE OF UTERUS    . TONSILLECTOMY AND ADENOIDECTOMY      Allergies  Allergen Reactions  . Clindamycin/Lincomycin Shortness Of Breath  . Methocarbamol Shortness Of Breath  . Clindamycin Swelling    Current Outpatient Medications  Medication Sig Dispense Refill  . dicyclomine (BENTYL) 10 MG capsule Take 10 mg by mouth 4 (four) times daily -  before meals and at bedtime.    Marland Kitchen desogestrel-ethinyl estradiol (APRI) 0.15-30 MG-MCG tablet TAKE 1 TABLET BY MOUTH EVERY DAY    . Iron-Vitamin C 65-125 MG TABS Take 1 tablet by mouth daily. 90 tablet 1  . Loratadine 10 MG CAPS Take by mouth. Reported on 09/16/2015    . sertraline (ZOLOFT) 100 MG tablet Take 2 tablets (200 mg total) by mouth daily. 60 tablet 2   No current facility-administered medications for this visit.    Family History Family History  Problem Relation Age of Onset  . Depression Mother   . Anxiety disorder Mother   . Alcohol abuse Mother   . Eating disorder Mother   . Obesity Father   . Heart disease Father   . Alcohol abuse Father   . Depression Father   . Polycystic ovary syndrome Sister   . Alcohol abuse  Maternal Grandmother   . Alcohol abuse Paternal Grandfather   . Depression Paternal Grandfather   . Colon cancer Maternal Grandfather       Social History Social History   Tobacco Use  . Smoking status: Never Smoker  . Smokeless tobacco: Never Used  Vaping Use  . Vaping Use: Never used  Substance Use Topics  . Alcohol use: No    Alcohol/week: 0.0 standard drinks  . Drug use: No        Review of Systems  Constitutional: Negative for weight loss.  HENT: Negative.   Eyes: Negative.   Respiratory: Negative.   Cardiovascular: Positive for palpitations.  Gastrointestinal: Positive for abdominal pain and nausea.  Genitourinary: Negative.   Neurological: Negative.   Psychiatric/Behavioral: Positive for depression (with OCD, anxiety).      Physical Exam Blood pressure (!) 145/87, pulse (!) 121, temperature 98.5 F (36.9 C), temperature source Oral, resp. rate 12, height 5\' 4"  (1.626 m), weight (!) 401 lb (181.9 kg), last menstrual period 11/15/2019, SpO2 94 %. Last Weight  Most recent update: 12/09/2019  1:56 PM   Weight  181.9 kg (401 lb)              CONSTITUTIONAL: Well developed, and nourished, severely obese, appropriately responsive and aware without distress.   EYES: Sclera non-icteric.   EARS, NOSE, MOUTH  AND THROAT: Mask worn.   Hearing is intact to voice.  NECK: Trachea is midline, and there is no jugular venous distension.  LYMPH NODES:  Lymph nodes in the neck are not enlarged. RESPIRATORY:  Lungs are clear, and breath sounds are equal bilaterally. Normal respiratory effort without pathologic use of accessory muscles. CARDIOVASCULAR: Heart is regular in rate and rhythm. GI: The abdomen is  soft, nontender, and nondistended. There were no palpable masses. I did not appreciate hepatosplenomegaly. There were normal bowel sounds. I could appreciate some tenderness on the cephalad aspect by deep palpation with in her umbilicus. MUSCULOSKELETAL:  Symmetrical muscle  tone appreciated in all four extremities.    SKIN: Skin turgor is normal. No pathologic skin lesions appreciated.  NEUROLOGIC:  Motor and sensation appear grossly normal.  Cranial nerves are grossly without defect. PSYCH:  Alert and oriented to person, place and time. Affect is appropriate for situation.  Data Reviewed I have personally reviewed what is currently available of the patient's imaging, recent labs and medical records.   Labs:  CBC Latest Ref Rng & Units 11/26/2019 11/06/2019 10/21/2019  WBC 4.0 - 10.5 K/uL 12.2(H) 11.1(H) 12.2(H)  Hemoglobin 12.0 - 15.0 g/dL 14.9 70.2 63.7  Hematocrit 36 - 46 % 39.5 39.4 39.4  Platelets 150 - 400 K/uL 371 - 353.0   CMP Latest Ref Rng & Units 10/21/2019 06/29/2012  Glucose 70 - 99 mg/dL 99 858(I)  BUN 6 - 23 mg/dL 10 6(L)  Creatinine 5.02 - 1.20 mg/dL 7.74 1.28(N)  Sodium 867 - 145 mEq/L 137 135(L)  Potassium 3.5 - 5.1 mEq/L 4.1 3.7  Chloride 96 - 112 mEq/L 102 105  CO2 19 - 32 mEq/L 26 21  Calcium 8.4 - 10.5 mg/dL 9.6 9.2  Total Protein 6.0 - 8.3 g/dL 7.2 7.4  Total Bilirubin 0.2 - 1.2 mg/dL 0.4 0.4  Alkaline Phos 39 - 117 U/L 94 92  AST 0 - 37 U/L 12 25  ALT 0 - 35 U/L 15 37      Imaging: Radiology review: CT images personally reviewed, imaging shared with the patient.   CLINICAL DATA:  Anterior abdominal pain. Evaluate for umbilical hernia.  EXAM: CT ABDOMEN AND PELVIS WITH CONTRAST  TECHNIQUE: Multidetector CT imaging of the abdomen and pelvis was performed using the standard protocol following bolus administration of intravenous contrast.  CONTRAST:  OMNIPAQUE IOHEXOL 300 MG/ML  SOLN  COMPARISON:  None.  FINDINGS: Lower chest: Normal heart size. Dependent atelectasis within the bilateral lower lobes. No pleural effusion.  Hepatobiliary: Liver is normal in size and contour. No focal lesion identified. Gallbladder is unremarkable.  Pancreas: Unremarkable  Spleen: Unremarkable  Adrenals/Urinary Tract:  Normal adrenal glands. Kidneys are symmetric in size. No hydronephrosis. 2 mm nonobstructing stone midpole right kidney. Urinary bladder is unremarkable.  Stomach/Bowel: No abnormal bowel wall thickening or evidence for bowel obstruction. No free fluid or free intraperitoneal air. Normal morphology of the stomach.  Vascular/Lymphatic: Normal caliber abdominal aorta. No retroperitoneal lymphadenopathy.  Reproductive: Probable calcified fibroid left uterine fundus.  Other: There is a fat containing periumbilical hernia with mild fat stranding (image 60; series 2).  Musculoskeletal: Lower thoracic and lumbar spine degenerative changes.  IMPRESSION: 1. There is a fat containing periumbilical hernia with mild fat stranding. 2. Nonobstructing 2 mm stone midpole right kidney.   Electronically Signed   By: Annia Belt M.D.   On: 12/04/2019 15:48   Within last 24 hrs: No results found.  Assessment  Symptomatic umbilical hernia. Patient Active Problem List   Diagnosis Date Noted  . Umbilical pain 12/02/2019  . Umbilical hernia without obstruction and without gangrene 12/02/2019  . Leukocytosis 11/18/2019  . Anxiety with depression 10/22/2019  . Class 3 severe obesity with body mass index (BMI) of 60.0 to 69.9 in adult (HCC) 10/22/2019  . Preventative health care 10/22/2019    Plan    When she was present, I failed to address the obvious, that her morbid obesity poses a much greater health risk than any pain that her umbilical hernia causes her.  Unfortunately I allowed the desire to please my patient to supersede the wisdom of pursuing what is best for her in the long haul. Typically I will leverage patients to lose weight prior to offering a repair, but I felt I could not dismiss her complaint of pain today even though it was not ongoing and present at the visit. We discussed umbilical hernia repair, even possibly with mesh considering an open repair rather than  laparoscopy.   I have discussed with her primary care that pursuing bariatrics may make a whole lot more sense.  Face-to-face time spent with the patient and accompanying care providers(if present) was 30 minutes, with more than 50% of the time spent counseling, educating, and coordinating care of the patient.      Campbell Lerner M.D., FACS 12/09/2019, 2:59 PM

## 2019-12-09 NOTE — Patient Instructions (Addendum)
Our surgery scheduler will call you within 24-48 hours to schedule your surgery. Please have the Blue surgery sheet available when speaking with her.   Umbilical Hernia, Adult  A hernia is a bulge of tissue that pushes through an opening between muscles. An umbilical hernia happens in the abdomen, near the belly button (umbilicus). The hernia may contain tissues from the small intestine, large intestine, or fatty tissue covering the intestines (omentum). Umbilical hernias in adults tend to get worse over time, and they require surgical treatment. There are several types of umbilical hernias. You may have:  A hernia located just above or below the umbilicus (indirect hernia). This is the most common type of umbilical hernia in adults.  A hernia that forms through an opening formed by the umbilicus (direct hernia).  A hernia that comes and goes (reducible hernia). A reducible hernia may be visible only when you strain, lift something heavy, or cough. This type of hernia can be pushed back into the abdomen (reduced).  A hernia that traps abdominal tissue inside the hernia (incarcerated hernia). This type of hernia cannot be reduced.  A hernia that cuts off blood flow to the tissues inside the hernia (strangulated hernia). The tissues can start to die if this happens. This type of hernia requires emergency treatment. What are the causes? An umbilical hernia happens when tissue inside the abdomen presses on a weak area of the abdominal muscles. What increases the risk? You may have a greater risk of this condition if you:  Are obese.  Have had several pregnancies.  Have a buildup of fluid inside your abdomen (ascites).  Have had surgery that weakens the abdominal muscles. What are the signs or symptoms? The main symptom of this condition is a painless bulge at or near the belly button. A reducible hernia may be visible only when you strain, lift something heavy, or cough. Other symptoms may  include:  Dull pain.  A feeling of pressure. Symptoms of a strangulated hernia may include:  Pain that gets increasingly worse.  Nausea and vomiting.  Pain when pressing on the hernia.  Skin over the hernia becoming red or purple.  Constipation.  Blood in the stool. How is this diagnosed? This condition may be diagnosed based on:  A physical exam. You may be asked to cough or strain while standing. These actions increase the pressure inside your abdomen and force the hernia through the opening in your muscles. Your health care provider may try to reduce the hernia by pressing on it.  Your symptoms and medical history. How is this treated? Surgery is the only treatment for an umbilical hernia. Surgery for a strangulated hernia is done as soon as possible. If you have a small hernia that is not incarcerated, you may need to lose weight before having surgery. Follow these instructions at home:  Lose weight, if told by your health care provider.  Do not try to push the hernia back in.  Watch your hernia for any changes in color or size. Tell your health care provider if any changes occur.  You may need to avoid activities that increase pressure on your hernia.  Do not lift anything that is heavier than 10 lb (4.5 kg) until your health care provider says that this is safe.  Take over-the-counter and prescription medicines only as told by your health care provider.  Keep all follow-up visits as told by your health care provider. This is important. Contact a health care provider if:    Your hernia gets larger.  Your hernia becomes painful. Get help right away if:  You develop sudden, severe pain near the area of your hernia.  You have pain as well as nausea or vomiting.  You have pain and the skin over your hernia changes color.  You develop a fever. This information is not intended to replace advice given to you by your health care provider. Make sure you discuss any  questions you have with your health care provider. Document Revised: 06/13/2017 Document Reviewed: 10/30/2016 Elsevier Patient Education  2020 [employer]  

## 2019-12-11 NOTE — Telephone Encounter (Signed)
Patient spoke with Selena Batten this morning about below

## 2019-12-12 ENCOUNTER — Encounter: Payer: Self-pay | Admitting: Nurse Practitioner

## 2019-12-15 ENCOUNTER — Ambulatory Visit: Payer: Self-pay | Admitting: Urology

## 2019-12-17 ENCOUNTER — Encounter: Payer: Self-pay | Admitting: Nurse Practitioner

## 2019-12-17 ENCOUNTER — Other Ambulatory Visit: Payer: Self-pay

## 2019-12-17 ENCOUNTER — Ambulatory Visit (INDEPENDENT_AMBULATORY_CARE_PROVIDER_SITE_OTHER): Payer: BC Managed Care – PPO | Admitting: Nurse Practitioner

## 2019-12-17 VITALS — BP 122/78 | HR 96 | Temp 97.8°F | Ht 64.0 in | Wt 399.0 lb

## 2019-12-17 DIAGNOSIS — K429 Umbilical hernia without obstruction or gangrene: Secondary | ICD-10-CM | POA: Diagnosis not present

## 2019-12-17 DIAGNOSIS — Z6841 Body Mass Index (BMI) 40.0 and over, adult: Secondary | ICD-10-CM

## 2019-12-17 DIAGNOSIS — R700 Elevated erythrocyte sedimentation rate: Secondary | ICD-10-CM

## 2019-12-17 DIAGNOSIS — R7303 Prediabetes: Secondary | ICD-10-CM | POA: Insufficient documentation

## 2019-12-17 DIAGNOSIS — E66813 Obesity, class 3: Secondary | ICD-10-CM

## 2019-12-17 LAB — SEDIMENTATION RATE: Sed Rate: 51 mm/hr — ABNORMAL HIGH (ref 0–20)

## 2019-12-17 NOTE — Patient Instructions (Signed)
I have placed referral in to the nutritionist to help with the diet management.   Insurance would like you to work on diet and exercise for at least 3 months and if you do not need weight loss goals which is usually 5% weight loss, then we can consider the prediabetes medicine.    Prediabetes medicine: There is [place] daily use and Wegovy weekly use.  I have requested information from our pharmacist.  You could probably look this up on-line as well.  Please obtain blood work today to recheck the sed rate.  I will check with urologist about the need for referral for the small kidney stone that does not seem to be getting any problem at this time.  Talk with your gynecologist about the uterine fibroids.  Please follow-up office visit 3 months to see how you are getting along.

## 2019-12-17 NOTE — Progress Notes (Signed)
Established Patient Office Visit  Subjective:  Patient ID: Nancy Molina, female    DOB: 11/13/1990  Age: 29 y.o. MRN: [id number]  CC:  Chief Complaint  Patient presents with  . Follow-up   HPI Nancy Molina is a 29 yo who presents for discussion about weight loss aids to help her get her weight down so she can have abdominal hernia surgery in future.  She met with Dr. Ronny Bacon. She is having less abdominal hernia discomfort and only noticed it if she folds herself in half with deep bending. She is no longer lifting her niece and has cut her baby sitting hours down. She is comfortable working on her weight and is motivated, but does not know her target BMI. She wants to know what exercises she can do to increase her core strength without bothering the hernia. Requests a Nutrition consult. Not bothered with IBS and has not taken Bentyl. Feeling less fatigue on iron supplementation and Dr. Tasia Catchings in Hematology is monitoring leukocytosis. Mental health is good on the Zoloft and counseling. Elevated ESR and Rheum consult in Nov. Hx of small non-obstructing kidney stone- Urology consult pending.   Past Medical History:  Diagnosis Date  . Anxiety   . Depression   . IBS (irritable bowel syndrome)   . Obesity   . Obsessive-compulsive disorder   . OCD (obsessive compulsive disorder)     Past Surgical History:  Procedure Laterality Date  . DILATION AND CURETTAGE OF UTERUS    . TONSILLECTOMY AND ADENOIDECTOMY      Family History  Problem Relation Age of Onset  . Depression Mother   . Anxiety disorder Mother   . Alcohol abuse Mother   . Eating disorder Mother   . Obesity Father   . Heart disease Father   . Alcohol abuse Father   . Depression Father   . Polycystic ovary syndrome Sister   . Alcohol abuse Maternal Grandmother   . Alcohol abuse Paternal Grandfather   . Depression Paternal Grandfather   . Colon cancer Maternal Grandfather     Social History    Socioeconomic History  . Marital status: Married    Spouse name: Not on file  . Number of children: 1  . Years of education: Not on file  . Highest education level: Not on file  Occupational History  . Occupation: homemaker  Tobacco Use  . Smoking status: Never Smoker  . Smokeless tobacco: Never Used  Vaping Use  . Vaping Use: Never used  Substance and Sexual Activity  . Alcohol use: No    Alcohol/week: 0.0 standard drinks  . Drug use: No  . Sexual activity: Yes    Birth control/protection: Pill  Other Topics Concern  . Not on file  Social History Narrative   Born in [place], later moved to [place]. She lives with husband and 28 yo daughter Willaim Rayas.   Two older sisters.   Social Determinants of Health   Financial Resource Strain:   . Difficulty of Paying Living Expenses:   Food Insecurity:   . Worried About Charity fundraiser in the Last Year:   . Arboriculturist in the Last Year:   Transportation Needs:   . Film/video editor (Medical):   Marland Kitchen Lack of Transportation (Non-Medical):   Physical Activity:   . Days of Exercise per Week:   . Minutes of Exercise per Session:   Stress:   . Feeling of Stress :   Social Connections:   .  Frequency of Communication with Friends and Family:   . Frequency of Social Gatherings with Friends and Family:   . Attends Religious Services:   . Active Member of Clubs or Organizations:   . Attends Archivist Meetings:   Marland Kitchen Marital Status:   Intimate Partner Violence:   . Fear of Current or Ex-Partner:   . Emotionally Abused:   Marland Kitchen Physically Abused:   . Sexually Abused:     Outpatient Medications Prior to Visit  Medication Sig Dispense Refill  . desogestrel-ethinyl estradiol (APRI) 0.15-30 MG-MCG tablet TAKE 1 TABLET BY MOUTH EVERY DAY    . dicyclomine (BENTYL) 10 MG capsule Take 10 mg by mouth 4 (four) times daily -  before meals and at bedtime.    . Iron-Vitamin C 65-125 MG TABS Take 1 tablet by mouth daily. 90 tablet 1  .  Loratadine 10 MG CAPS Take by mouth. Reported on 09/16/2015    . sertraline (ZOLOFT) 100 MG tablet Take 2 tablets (200 mg total) by mouth daily. 60 tablet 2   No facility-administered medications prior to visit.    Allergies  Allergen Reactions  . Clindamycin/Lincomycin Shortness Of Breath  . Methocarbamol Shortness Of Breath  . Clindamycin Swelling   Review of Systems Pertinent positives as noted in history of present illness otherwise negative.   Objective:    Physical Exam Vitals reviewed.  Constitutional:      Appearance: She is obese.  HENT:     Head: Normocephalic.  Cardiovascular:     Rate and Rhythm: Normal rate and regular rhythm.     Pulses: Normal pulses.     Heart sounds: Normal heart sounds.  Pulmonary:     Effort: Pulmonary effort is normal.     Breath sounds: Normal breath sounds.  Abdominal:     Palpations: Abdomen is soft.     Hernia: A hernia is present.     Comments: Small umbilical hernia- non-tender  Neurological:     Mental Status: She is alert.     BP 122/78 (BP Location: Left Arm, Patient Position: Sitting, Cuff Size: Large)   Pulse 96   Temp 97.8 F (36.6 C) (Oral)   Ht 5' 4" (1.626 m)   Wt (!) 399 lb (181 kg)   SpO2 97%   BMI 68.49 kg/m  Wt Readings from Last 3 Encounters:  12/17/19 (!) 399 lb (181 kg)  12/09/19 (!) 401 lb (181.9 kg)  12/02/19 (!) 401 lb (181.9 kg)    Health Maintenance Due  Topic Date Due  . COVID-19 Vaccine (1) Never done  . TETANUS/TDAP  Never done  . PAP SMEAR-Modifier  Never done  . INFLUENZA VACCINE  12/14/2019   There are no preventive care reminders to display for this patient.  Lab Results  Component Value Date   TSH 1.57 10/21/2019   Lab Results  Component Value Date   WBC 12.2 (H) 11/26/2019   HGB 13.1 11/26/2019   HCT 39.5 11/26/2019   MCV 77.0 (L) 11/26/2019   PLT 371 11/26/2019   Lab Results  Component Value Date   NA 137 10/21/2019   K 4.1 10/21/2019   CO2 26 10/21/2019   GLUCOSE 99  10/21/2019   BUN 10 10/21/2019   CREATININE 0.62 10/21/2019   BILITOT 0.4 10/21/2019   ALKPHOS 94 10/21/2019   AST 12 10/21/2019   ALT 15 10/21/2019   PROT 7.2 10/21/2019   ALBUMIN 4.3 10/21/2019   CALCIUM 9.6 10/21/2019   ANIONGAP 9 06/29/2012  GFR 112.82 10/21/2019   Lab Results  Component Value Date   CHOL 192 10/21/2019   Lab Results  Component Value Date   HDL 46.10 10/21/2019   Lab Results  Component Value Date   LDLCALC 118 (H) 10/21/2019   Lab Results  Component Value Date   TRIG 137.0 10/21/2019   Lab Results  Component Value Date   CHOLHDL 4 10/21/2019   Lab Results  Component Value Date   HGBA1C 6.2 10/21/2019      Assessment & Plan:   Problem List Items Addressed This Visit      Other   Class 3 severe obesity with body mass index (BMI) of 60.0 to 69.9 in adult [facility]) - Primary   Relevant Orders   Amb ref to Medical Nutrition Therapy-MNT   Umbilical hernia without obstruction and without gangrene   Pre-diabetes   Relevant Orders   Amb ref to Medical Nutrition Therapy-MNT   ESR raised   Relevant Orders   Sedimentation rate (Completed)      No orders of the defined types were placed in this encounter.  I have placed referral in to the nutritionist to help with the diet management.   Insurance would like you to work on diet and exercise for at least 3 months and if you do not need weight loss goals which is usually 5% weight loss, then we can consider the prediabetes medicine.    Prediabetes medicine: There is [place] daily use and Wegovy weekly use.  I have requested information from our pharmacist.  You could probably look this up on-line as well.  Please obtain blood work today to recheck the sed rate.  I will check with urologist about the need for referral for the small kidney stone that does not seem to be getting any problem at this time.  Talk with your gynecologist about the uterine fibroids.  Follow-up: Return in about 3 months  (around 03/18/2020).   This visit occurred during the SARS-CoV-2 public health emergency.  Safety protocols were in place, including screening questions prior to the visit, additional usage of staff PPE, and extensive cleaning of exam room while observing appropriate contact time as indicated for disinfecting solutions.   Denice Paradise, NP

## 2019-12-20 ENCOUNTER — Other Ambulatory Visit (HOSPITAL_COMMUNITY): Payer: Self-pay | Admitting: Psychiatry

## 2019-12-30 ENCOUNTER — Other Ambulatory Visit: Payer: Self-pay

## 2019-12-30 ENCOUNTER — Telehealth (INDEPENDENT_AMBULATORY_CARE_PROVIDER_SITE_OTHER): Payer: BC Managed Care – PPO | Admitting: Psychiatry

## 2019-12-30 DIAGNOSIS — F422 Mixed obsessional thoughts and acts: Secondary | ICD-10-CM | POA: Diagnosis not present

## 2019-12-30 DIAGNOSIS — F3341 Major depressive disorder, recurrent, in partial remission: Secondary | ICD-10-CM

## 2019-12-30 MED ORDER — TRAZODONE HCL 50 MG PO TABS: 50.0000 mg | ORAL_TABLET | Freq: Every evening | ORAL | 1 refills | Status: DC | PRN

## 2019-12-30 NOTE — Progress Notes (Signed)
BH MD/PA/NP OP Progress Note  12/30/2019 8:39 AM Nancy Molina  MRN:  [id number] Interview was conducted using videoconferencing application and I verified that I was speaking with the correct person using two identifiers. I discussed the limitations of evaluation and management by telemedicine and  the availability of in person appointments. Patient expressed understanding and agreed to proceed. Patient location - home; physician - home office.  Chief Complaint: Middle insomnia.  HPI: 29 yo married  white female with a long history of major depressive disorder, obsessive-compulsive disorder and binge eating disorder who comes to establish regular psychiatric follow up. She had been under care of Dr. Henrietta Dine in 2017 who has increased previously prescribed sertraline to 200 mg and this brought about full resolution of depression and OCD symptoms. Two years ago she decided to wean self off sertraline and eventually her symptoms, primarily OCD, returned.  She admits to having constant obsessive thoughts (about safety mostly) and ritualistic behaviors such as checking doors, locks, windows and counting. These behaviors interfere with her daily activities, cause distress and some depression. Her sleep which was poor in the past (and required taking trazodone) is fairly good and her bing eating is under much better control than it was few years ago. She was tried on Vyvanse 30 mg in the past but did not like how it made her feel. Patient has not been on any other antidepressants besides sertraline. It has been restarted few weeks ago by her PCP at a 50 mg dose. Given good response to sertraline in the post we titrated dose up to 200 mg and her depression as well as obsessive thoughts resolved. She still struggles with middle insomnia though.   Visit Diagnosis:    ICD-10-CM   1. Mixed obsessional thoughts and acts  F42.2   2. Major depressive disorder, recurrent episode, in partial remission (HCC)   F33.41     Past Psychiatric History: Please see intake H&P.  Past Medical History:  Past Medical History:  Diagnosis Date  . Anxiety   . Depression   . IBS (irritable bowel syndrome)   . Obesity   . Obsessive-compulsive disorder   . OCD (obsessive compulsive disorder)     Past Surgical History:  Procedure Laterality Date  . DILATION AND CURETTAGE OF UTERUS    . TONSILLECTOMY AND ADENOIDECTOMY      Family Psychiatric History: Reviewed.  Family History:  Family History  Problem Relation Age of Onset  . Depression Mother   . Anxiety disorder Mother   . Alcohol abuse Mother   . Eating disorder Mother   . Obesity Father   . Heart disease Father   . Alcohol abuse Father   . Depression Father   . Polycystic ovary syndrome Sister   . Alcohol abuse Maternal Grandmother   . Alcohol abuse Paternal Grandfather   . Depression Paternal Grandfather   . Colon cancer Maternal Grandfather     Social History:  Social History   Socioeconomic History  . Marital status: Married    Spouse name: Not on file  . Number of children: 1  . Years of education: Not on file  . Highest education level: Not on file  Occupational History  . Occupation: homemaker  Tobacco Use  . Smoking status: Never Smoker  . Smokeless tobacco: Never Used  Vaping Use  . Vaping Use: Never used  Substance and Sexual Activity  . Alcohol use: No    Alcohol/week: 0.0 standard drinks  . Drug use:  No  . Sexual activity: Yes    Birth control/protection: Pill  Other Topics Concern  . Not on file  Social History Narrative   Born in [place], [place]. She lives with husband and 29 yo daughter [name].   Two older sisters.   Social Determinants of Health   Financial Resource Strain:   . Difficulty of Paying Living Expenses:   Food Insecurity:   . Worried About Programme researcher, broadcasting/film/video in the Last Year:   . Barista in the Last Year:   Transportation Needs:   . Freight forwarder (Medical):    Marland Kitchen Lack of Transportation (Non-Medical):   Physical Activity:   . Days of Exercise per Week:   . Minutes of Exercise per Session:   Stress:   . Feeling of Stress :   Social Connections:   . Frequency of Communication with Friends and Family:   . Frequency of Social Gatherings with Friends and Family:   . Attends Religious Services:   . Active Member of Clubs or Organizations:   . Attends Banker Meetings:   Marland Kitchen Marital Status:     Allergies:  Allergies  Allergen Reactions  . Clindamycin/Lincomycin Shortness Of Breath  . Methocarbamol Shortness Of Breath  . Clindamycin Swelling    Metabolic Disorder Labs: Lab Results  Component Value Date   HGBA1C 6.2 10/21/2019   No results found for: PROLACTIN Lab Results  Component Value Date   CHOL 192 10/21/2019   TRIG 137.0 10/21/2019   HDL 46.10 10/21/2019   CHOLHDL 4 10/21/2019   VLDL 27.4 10/21/2019   LDLCALC 118 (H) 10/21/2019   Lab Results  Component Value Date   TSH 1.57 10/21/2019    Therapeutic Level Labs: No results found for: LITHIUM No results found for: VALPROATE No components found for:  CBMZ  Current Medications: Current Outpatient Medications  Medication Sig Dispense Refill  . desogestrel-ethinyl estradiol (APRI) 0.15-30 MG-MCG tablet TAKE 1 TABLET BY MOUTH EVERY DAY    . dicyclomine (BENTYL) 10 MG capsule Take 10 mg by mouth 4 (four) times daily -  before meals and at bedtime.    . Iron-Vitamin C 65-125 MG TABS Take 1 tablet by mouth daily. 90 tablet 1  . Loratadine 10 MG CAPS Take by mouth. Reported on 09/16/2015    . sertraline (ZOLOFT) 100 MG tablet TAKE 2 TABLETS BY MOUTH EVERY DAY 180 tablet 1  . traZODone (DESYREL) 50 MG tablet Take 1 tablet (50 mg total) by mouth at bedtime as needed for sleep. 90 tablet 1   No current facility-administered medications for this visit.     Psychiatric Specialty Exam: Review of Systems  Psychiatric/Behavioral: Positive for sleep disturbance. The  patient is nervous/anxious.   All other systems reviewed and are negative.   There were no vitals taken for this visit.There is no height or weight on file to calculate BMI.  General Appearance: Casual and Well Groomed  Eye Contact:  Good  Speech:  Clear and Coherent and Normal Rate  Volume:  Normal  Mood:  Mildly anxious.  Affect:  Full Range  Thought Process:  Goal Directed and Linear  Orientation:  Full (Time, Place, and Person)  Thought Content: Logical   Suicidal Thoughts:  No  Homicidal Thoughts:  No  Memory:  Immediate;   Good Recent;   Good Remote;   Good  Judgement:  Good  Insight:  Good  Psychomotor Activity:  Normal  Concentration:  Concentration:  Good  Recall:  Good  Fund of Knowledge: Good  Language: Good  Akathisia:  Negative  Handed:  Right  AIMS (if indicated): not done  Assets:  Communication Skills Desire for Improvement Financial Resources/Insurance Housing Social Support  ADL's:  Intact  Cognition: WNL  Sleep:  Fair   Screenings: GAD-7     Office Visit from 11/18/2019 in [facility] [place] Office Visit from 10/21/2019 in [facility]  Total GAD-7 Score 11 19    PHQ2-9     Office Visit from 11/18/2019 in [facility] [place] Office Visit from 10/21/2019 in [facility]  PHQ-2 Total Score 2 4  PHQ-9 Total Score 6 16       Assessment and Plan: 29 yo married  white female with a long history of major depressive disorder, obsessive-compulsive disorder and binge eating disorder who comes to establish regular psychiatric follow up. She had been under care of Dr. Henrietta Dine in 2017 who has increased previously prescribed sertraline to 200 mg and this brought about full resolution of depression and OCD symptoms. Two years ago she decided to wean self off sertraline and eventually her symptoms, primarily OCD, returned.  She admits to having constant obsessive thoughts (about safety mostly) and ritualistic  behaviors such as checking doors, locks, windows and counting. These behaviors interfere with her daily activities, cause distress and some depression. Her sleep which was poor in the past (and required taking trazodone) is fairly good and her bing eating is under much better control than it was few years ago. She was tried on Vyvanse 30 mg in the past but did not like how it made her feel. Patient has not been on any other antidepressants besides sertraline. It has been restarted few weeks ago by her PCP at a 50 mg dose. Given good response to sertraline in the post we titrated dose up to 200 mg and her depression as well as obsessive thoughts resolved. She still struggles with middle insomnia though.  Dx: OCD; MDD recurrent in partial remission  Plan:. We will continue sertraline 200 mg and resume trazodone 50 mg prn insomnia. Next appointment in 4 months. The plan was discussed with patient who had an opportunity to ask questions and these were all answered. I spend 15 minutes in videoconferencing with the patient     Magdalene Patricia, MD 12/30/2019, 8:39 AM

## 2020-01-08 ENCOUNTER — Ambulatory Visit: Payer: BC Managed Care – PPO | Admitting: Dietician

## 2020-01-15 ENCOUNTER — Ambulatory Visit: Payer: BC Managed Care – PPO | Admitting: Dietician

## 2020-01-29 ENCOUNTER — Encounter: Payer: BC Managed Care – PPO | Attending: Nurse Practitioner | Admitting: Dietician

## 2020-01-29 ENCOUNTER — Encounter: Payer: Self-pay | Admitting: Dietician

## 2020-01-29 ENCOUNTER — Other Ambulatory Visit: Payer: Self-pay

## 2020-01-29 VITALS — Ht 64.0 in | Wt >= 6400 oz

## 2020-01-29 DIAGNOSIS — Z6841 Body Mass Index (BMI) 40.0 and over, adult: Secondary | ICD-10-CM

## 2020-01-29 NOTE — Patient Instructions (Signed)
   Include a veg or fruit or both with as many meals as possible, make the veg portions generous while controlling portions of starches and meats.   Keep meals as relaxed and low stress as possible to help limit distractions and eat slowly.   Use menus for balanced meal ideas. Other ideas can be found when searching for "healthy" options on Pinterest, or [employer] app, or others.

## 2020-01-29 NOTE — Progress Notes (Signed)
Medical Nutrition Therapy: Visit start time: 2947  end time: 1145  Assessment:  Diagnosis: obesity Past medical history: IBS, umbilical hernia, possible arthritis in lower back Psychosocial issues/ stress concerns: depression, anxiety  Preferred learning method:  . Visual   Current weight: 402.0lbs Height: 5'4" Medications, supplements: reconciled list in medical record  Progress and evaluation:   Patient reports stable weight for some time. She reports struggling with weight since childhood. Has gone to [employer] in the past weight watchers with support from her mother, with limited or short-term success only.   Has autistic daughter with feeding issues, so mealtimes can be stressful. Daughter also has peanut and tree nut allergies so these foods are not eaten routinely in the home.     Trying to lose weight prior to having hernia repair  Physical activity: none currently; cares for daughter and young niece daily  Dietary Intake:  Usual eating pattern includes 3 meals and 1-2 snacks per day. Dining out frequency: 2-4 meals per week.  Breakfast: grits, oatmeal; eggs and bacon Snack: none or same as pm Lunch: sandwich with lunch meat; pasta salad; frozen taquitos Snack: crunchy like chips, cheese-it crackers, quaker bites, etc Supper: out several times a week; pot roast; spaghetti; pizza; mac and cheese Snack: none or same as pm Beverages: water, sweet tea, coffee, soda only rarely  Nutrition Care Education: Topics covered:  Basic nutrition: basic food groups, appropriate nutrient balance, appropriate meal and snack schedule, general nutrition guidelines    Weight control: importance of low sugar and low fat choices, portion control strategies including using small plates/containers, eating slowly and chewing foods more, drinking water with meals; estimated energy needs for weight loss at 1600kcal, provided guidance for 45% CHO, 25% pro, 30% fat Advanced nutrition:  cooking  techniques, dining out  Nutritional Diagnosis:  [place]-3.3 Overweight/obesity As related to history of obesity, excess calories, inadequate physical activity, stress.  As evidenced by patient with current BMI of 69.  Intervention:   Instruction and discussion as noted above.  Patient voices motivation to work on positive changes, she and husband both want to improve lifestyle and health.  Established nutrition goals with input from patient.  Education Materials given:  . Plate Planner with food lists, sample meal pattern . Sample menus . Build a [employer] . Goals/ instructions   Learner/ who was taught:  . Patient    Level of understanding: Marland Kitchen Verbalizes/ demonstrates competency   Demonstrated degree of understanding via:   Teach back Learning barriers: . None   Willingness to learn/ readiness for change: . Eager, change in progress   Monitoring and Evaluation:  Dietary intake, exercise, and body weight      follow up: 02/26/20 at 11:00am

## 2020-02-18 ENCOUNTER — Ambulatory Visit: Payer: BC Managed Care – PPO | Admitting: Nurse Practitioner

## 2020-02-26 ENCOUNTER — Ambulatory Visit: Payer: BC Managed Care – PPO | Admitting: Dietician

## 2020-03-18 ENCOUNTER — Ambulatory Visit: Payer: BC Managed Care – PPO | Admitting: Nurse Practitioner

## 2020-03-23 ENCOUNTER — Other Ambulatory Visit: Payer: Self-pay

## 2020-03-25 ENCOUNTER — Encounter: Payer: Self-pay | Admitting: Nurse Practitioner

## 2020-03-25 ENCOUNTER — Other Ambulatory Visit: Payer: Self-pay

## 2020-03-25 ENCOUNTER — Ambulatory Visit: Payer: Managed Care, Other (non HMO) | Admitting: Nurse Practitioner

## 2020-03-25 DIAGNOSIS — F418 Other specified anxiety disorders: Secondary | ICD-10-CM | POA: Diagnosis not present

## 2020-03-25 DIAGNOSIS — Z23 Encounter for immunization: Secondary | ICD-10-CM | POA: Diagnosis not present

## 2020-03-25 DIAGNOSIS — R7303 Prediabetes: Secondary | ICD-10-CM

## 2020-03-25 DIAGNOSIS — Z6841 Body Mass Index (BMI) 40.0 and over, adult: Secondary | ICD-10-CM | POA: Diagnosis not present

## 2020-03-25 DIAGNOSIS — Z0000 Encounter for general adult medical examination without abnormal findings: Secondary | ICD-10-CM

## 2020-03-25 NOTE — Patient Instructions (Addendum)
Continue with the diet plan by the nutritionist.   I have placed a referral in to the [facility] team- you will get a call to make an appt.   Advise a  diet rich in lean protein, nonfat low-fat dairy, vegetables, fruits, complex carbohydrates with aerobic exercise  goal of 30 min x 5 times a week and add in twice weekly weight resistance training.  Flu vaccine today.   Tdap at your pharmacy.   Please see your gynecologist  for PAP and birth control refills.   Please follow up with psychiatry as you plan and discuss Zoloft effects.      Preventive Care 74-36 Years Old, Female Preventive care refers to visits with your health care provider and lifestyle choices that can promote health and wellness. This includes:  A yearly physical exam. This may also be called an annual well check.  Regular dental visits and eye exams.  Immunizations.  Screening for certain conditions.  Healthy lifestyle choices, such as eating a healthy diet, getting regular exercise, not using drugs or products that contain nicotine and tobacco, and limiting alcohol use. What can I expect for my preventive care visit? Physical exam Your health care provider will check your:  Height and weight. This may be used to calculate body mass index (BMI), which tells if you are at a healthy weight.  Heart rate and blood pressure.  Skin for abnormal spots. Counseling Your health care provider may ask you questions about your:  Alcohol, tobacco, and drug use.  Emotional well-being.  Home and relationship well-being.  Sexual activity.  Eating habits.  Work and work Statistician.  Method of birth control.  Menstrual cycle.  Pregnancy history. What immunizations do I need?  Influenza (flu) vaccine  This is recommended every year. Tetanus, diphtheria, and pertussis (Tdap) vaccine  You may need a Td booster every 10 years. Varicella (chickenpox) vaccine  You may need this if  you have not been vaccinated. Human papillomavirus (HPV) vaccine  If recommended by your health care provider, you may need three doses over 6 months. Measles, mumps, and rubella (MMR) vaccine  You may need at least one dose of MMR. You may also need a second dose. Meningococcal conjugate (MenACWY) vaccine  One dose is recommended if you are age 69-21 years and a first-year college student living in a residence hall, or if you have one of several medical conditions. You may also need additional booster doses. Pneumococcal conjugate (PCV13) vaccine  You may need this if you have certain conditions and were not previously vaccinated. Pneumococcal polysaccharide (PPSV23) vaccine  You may need one or two doses if you smoke cigarettes or if you have certain conditions. Hepatitis A vaccine  You may need this if you have certain conditions or if you travel or work in places where you may be exposed to hepatitis A. Hepatitis B vaccine  You may need this if you have certain conditions or if you travel or work in places where you may be exposed to hepatitis B. Haemophilus influenzae type b (Hib) vaccine  You may need this if you have certain conditions. You may receive vaccines as individual doses or as more than one vaccine together in one shot (combination vaccines). Talk with your health care provider about the risks and benefits of combination vaccines. What tests do I need?  Blood tests  Lipid and cholesterol levels. These may be checked every 5 years starting at age 58.  Hepatitis C test.  Hepatitis B test. Screening  Diabetes screening. This is done by checking your blood sugar (glucose) after you have not eaten for a while (fasting).  Sexually transmitted disease (STD) testing.  BRCA-related cancer screening. This may be done if you have a family history of breast, ovarian, tubal, or peritoneal cancers.  Pelvic exam and Pap test. This may be done every 3 years starting at age  21. Starting at age 30, this may be done every 5 years if you have a Pap test in combination with an HPV test. Talk with your health care provider about your test results, treatment options, and if necessary, the need for more tests. Follow these instructions at home: Eating and drinking   Eat a diet that includes fresh fruits and vegetables, whole grains, lean protein, and low-fat dairy.  Take vitamin and mineral supplements as recommended by your health care provider.  Do not drink alcohol if: ? Your health care provider tells you not to drink. ? You are pregnant, may be pregnant, or are planning to become pregnant.  If you drink alcohol: ? Limit how much you have to 0-1 drink a day. ? Be aware of how much alcohol is in your drink. In the [place] one drink equals one 12 oz bottle of beer (355 mL), one 5 oz glass of wine (148 mL), or one 1 oz glass of hard liquor (44 mL). Lifestyle  Take daily care of your teeth and gums.  Stay active. Exercise for at least 30 minutes on 5 or more days each week.  Do not use any products that contain nicotine or tobacco, such as cigarettes, e-cigarettes, and chewing tobacco. If you need help quitting, ask your health care provider.  If you are sexually active, practice safe sex. Use a condom or other form of birth control (contraception) in order to prevent pregnancy and STIs (sexually transmitted infections). If you plan to become pregnant, see your health care provider for a preconception visit. What's next?  Visit your health care provider once a year for a well check visit.  Ask your health care provider how often you should have your eyes and teeth checked.  Stay up to date on all vaccines. This information is not intended to replace advice given to you by your health care provider. Make sure you discuss any questions you have with your health care provider. Document Revised: 01/10/2018 Document Reviewed: 01/10/2018 Elsevier Patient Education   2020 Elsevier Inc.  

## 2020-03-25 NOTE — Progress Notes (Signed)
[place]

## 2020-03-25 NOTE — Progress Notes (Signed)
Established Patient Office Visit  Subjective:  Patient ID: Nancy Molina, female    DOB: 08/01/1990  Age: 29 y.o. MRN: [id number]  CC:  Chief Complaint  Patient presents with  . Annual Exam    Pap smear    HPI Nancy Molina is a 29 yo presents for 3 mos follow up.   She wants her yearly GYN/ PAP test done today instead of GYN as they keep rescheduling her appt. She has a hx of uterine fibroids. She has no pelvic/vaginal complaints. Taking Apri oral birth control.   IDA , leukocytosis, and elevated ESR: Evaluated by Dr. Tasia Catchings in  Hematology monitoring CBC every 6 mos. She is on Vitron C daily.She has an appt with [facility]HEUM arranged to address elevated ESR.   BMI 70.55/Morbid Obesity/Pre diabetes/elevated LDL:  [name] saw general surgeon for small umbilical hernia and was advised wt loss before elective surgery. She saw Erlene Quan RD 01/29/2020 and a diet plan was discussed. She is gaining  weight. Aury is now amendable to seeing [facility] team for additional assistance. She was not interested in Bariatric surgery and at the same repair hernia. We have discussed addition of GLP1 and will need insurance support for affordability. Phentermine may not be recommended with tachycardia.   Lab Results  Component Value Date   HGBA1C 6.2 10/21/2019   Wt Readings from Last 3 Encounters:  03/25/20 (!) 411 lb (186.4 kg)  01/29/20 (!) 402 lb (182.3 kg)  12/17/19 (!) 399 lb (181 kg)   Lab Results  Component Value Date   CHOL 192 10/21/2019   HDL 46.10 10/21/2019   LDLCALC 118 (H) 10/21/2019   TRIG 137.0 10/21/2019   CHOLHDL 4 10/21/2019   Anxiety/depression: Doing well on Zoloft 50 mg and is back up to Zoloft 200 mg because of severity of OCD. She is doing well but will  monitor to make sure she does not feel emotionless. Seeing Psychiatry Dr. Montel Culver.   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  Tobacco: none Alcohol: none  Past Medical History:  Diagnosis Date  . Anxiety   . Depression   . IBS (irritable bowel syndrome)   . Obesity   . Obsessive-compulsive disorder   . OCD (obsessive compulsive disorder)     Past Surgical History:  Procedure Laterality Date  . DILATION AND CURETTAGE OF UTERUS    . TONSILLECTOMY AND ADENOIDECTOMY      Family History  Problem Relation Age of Onset  . Depression Mother   . Anxiety disorder Mother   . Alcohol abuse Mother   . Eating disorder Mother   . Obesity Father   . Heart disease Father   . Alcohol abuse Father   . Depression Father   . Polycystic ovary syndrome Sister   . Alcohol abuse Maternal Grandmother   . Alcohol abuse Paternal Grandfather   . Depression Paternal Grandfather   . Colon cancer Maternal Grandfather     Social History   Socioeconomic History  . Marital status: Married    Spouse name: Not on file  . Number of children: 1  . Years of education: Not on file  . Highest education level: Not on file  Occupational History  . Occupation: homemaker  Tobacco Use  . Smoking status: Never Smoker  . Smokeless tobacco: Never Used  Vaping Use  . Vaping Use: Never used  Substance and Sexual Activity  . Alcohol use: No    Alcohol/week: 0.0 standard drinks  . Drug use: No  . Sexual activity: Yes    Birth control/protection: Pill  Other Topics Concern  . Not on file  Social History Narrative   Born in [place], later moved to [place]. She lives with husband and 65 yo daughter Willaim Rayas.   Two older sisters. 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   Social Determinants of Health   Financial Resource Strain:   .  Difficulty of Paying Living Expenses: Not on file  Food Insecurity:   . Worried About Charity fundraiser in the Last Year: Not on file  . Ran Out of Food in the Last Year: Not on file  Transportation Needs:   . Lack of Transportation (Medical): Not on file  . Lack of Transportation (Non-Medical): Not on file  Physical Activity:   . Days of Exercise per Week: Not on file  . Minutes of Exercise per Session: Not on file  Stress:   . Feeling of Stress : Not on file  Social Connections:   . Frequency of Communication with Friends and Family: Not on file  . Frequency of Social Gatherings with Friends and Family: Not on file  . Attends Religious Services: Not on file  . Active Member of Clubs or Organizations: Not on file  . Attends Archivist Meetings: Not on file  . Marital Status: Not on file  Intimate Partner Violence:   . Fear of Current or Ex-Partner: Not on file  . Emotionally Abused: Not on file  . Physically Abused: Not on file  . Sexually Abused: Not on file    Outpatient Medications Prior to Visit  Medication Sig Dispense Refill  . desogestrel-ethinyl estradiol (APRI) 0.15-30 MG-MCG tablet TAKE 1 TABLET BY MOUTH EVERY DAY    . dicyclomine (BENTYL) 10 MG capsule Take 10 mg by mouth 4 (four) times daily -  before meals and at bedtime.    . Iron-Vitamin C 65-125 MG TABS Take 1 tablet by mouth daily. 90 tablet 1  . Loratadine 10 MG CAPS Take by mouth. Reported on 09/16/2015    . Multiple Vitamin (MULTIVITAMIN ADULT PO) Take by mouth.    . sertraline (ZOLOFT) 100 MG tablet TAKE 2 TABLETS BY MOUTH EVERY DAY 180 tablet 1  . traZODone (DESYREL) 50 MG tablet Take 1 tablet (50 mg total) by mouth at bedtime as needed for sleep. 90 tablet 1   No facility-administered medications prior to visit.    Allergies  Allergen Reactions  . Clindamycin/Lincomycin Shortness Of Breath  . Methocarbamol Shortness Of Breath  . Clindamycin Swelling    Review of Systems  Constitutional:  Negative for chills and fever.  HENT: Negative.   Eyes: Negative.   Respiratory: Negative.   Cardiovascular: Negative.   Gastrointestinal: Negative.        Umbilical hernia: Not bothersome at this time.   Endocrine: Negative for cold intolerance, heat intolerance and polyuria.  Genitourinary: Negative.        Incidental finding  on CT of 2 mm non obstructing right  kidney stone with referral to Urology. She cancelled the Urology referral.  Musculoskeletal: Positive for back pain.  She has a RHEUM appt for elevated ESR. Current 3days of mild sore muscle lower back- esp if she turns to the right. No injury.   Skin: Negative.   Allergic/Immunologic: Negative.   Neurological: Negative.   Hematological: Negative.   Psychiatric/Behavioral:       Doing better with anxiety and depression. Sleeping with Trazodone 50 mg as needed- not daily Zoloft 100 mg twice a day and numbs her feelings.       Objective:    Physical Exam Exam conducted with a chaperone present.  HENT:     Head: Normocephalic and atraumatic.  Eyes:     Conjunctiva/sclera: Conjunctivae normal.     Pupils: Pupils are equal, round, and reactive to light.  Cardiovascular:     Rate and Rhythm: Normal rate and regular rhythm.     Pulses: Normal pulses.     Heart sounds: Normal heart sounds.  Pulmonary:     Effort: Pulmonary effort is normal.     Breath sounds: Normal breath sounds.  Chest:     Breasts:        Right: Inverted nipple present. No mass, skin change or tenderness.        Left: Inverted nipple present. No mass, skin change or tenderness.  Abdominal:     Palpations: Abdomen is soft.     Tenderness: There is no abdominal tenderness.     Hernia: A hernia is present.     Comments: Small umbilical  Genitourinary:    Vagina: Normal.     Comments: Unable to visualize the cervix with large adult speculum. Pt discloses that she has a retroverted, difficult to find cervix requiring a special longer sized  speculum.Will refer her to GYN for a complete pelvic exam.  Musculoskeletal:        General: Normal range of motion.     Cervical back: Normal range of motion.     Comments: Mild right lower back tenderness worse with twisting.   Lymphadenopathy:     Upper Body:     Right upper body: No supraclavicular or axillary adenopathy.     Left upper body: No supraclavicular or axillary adenopathy.  Skin:    General: Skin is warm and dry.  Neurological:     General: No focal deficit present.     Mental Status: She is oriented to person, place, and time.  Psychiatric:        Mood and Affect: Mood normal.        Behavior: Behavior normal.     BP 124/86 (BP Location: Left Arm, Patient Position: Sitting, Cuff Size: Large)   Pulse (!) 110   Temp 97.9 F (36.6 C) (Oral)   Ht 5' 4"  (1.626 m)   Wt (!) 411 lb (186.4 kg)   SpO2 96%   BMI 70.55 kg/m  Wt Readings from Last 3 Encounters:  03/25/20 (!) 411 lb (186.4 kg)  01/29/20 (!) 402 lb (182.3 kg)  12/17/19 (!) 399 lb (181 kg)   Pulse Readings from Last 3 Encounters:  03/25/20 (!) 110  12/17/19 96  12/09/19 (!) 121    BP Readings from Last 3 Encounters:  03/25/20 124/86  12/17/19 122/78  12/09/19 (!) 145/87    Lab Results  Component Value Date   CHOL 192 10/21/2019   HDL 46.10 10/21/2019   LDLCALC 118 (H) 10/21/2019   TRIG 137.0 10/21/2019   CHOLHDL 4 10/21/2019      Health Maintenance Due  Topic Date Due  . TETANUS/TDAP  Never done  . PAP SMEAR-Modifier  Never done    There are no preventive care reminders to display for this patient.  Lab Results  Component Value Date   TSH 1.57 10/21/2019   Lab Results  Component Value Date   WBC 12.2 (H) 11/26/2019   HGB 13.1 11/26/2019   HCT 39.5 11/26/2019   MCV 77.0 (L) 11/26/2019   PLT 371 11/26/2019   Lab Results  Component Value Date   NA 137 10/21/2019   K 4.1 10/21/2019   CO2 26 10/21/2019   GLUCOSE 99 10/21/2019   BUN 10 10/21/2019   CREATININE 0.62 10/21/2019    BILITOT 0.4 10/21/2019   ALKPHOS 94 10/21/2019   AST 12 10/21/2019   ALT 15 10/21/2019   PROT 7.2 10/21/2019   ALBUMIN 4.3 10/21/2019   CALCIUM 9.6 10/21/2019   ANIONGAP 9 06/29/2012   GFR 112.82 10/21/2019   Lab Results  Component Value Date   CHOL 192 10/21/2019   Lab Results  Component Value Date   HDL 46.10 10/21/2019   Lab Results  Component Value Date   LDLCALC 118 (H) 10/21/2019   Lab Results  Component Value Date   TRIG 137.0 10/21/2019   Lab Results  Component Value Date   CHOLHDL 4 10/21/2019   Lab Results  Component Value Date   HGBA1C 6.2 10/21/2019      Assessment & Plan:   Problem List Items Addressed This Visit      Other   Anxiety with depression   Morbid obesity ([place]) - Primary   Relevant Orders   Amb Ref to Medical Weight Management   Prediabetes   Relevant Orders   Amb Ref to Medical Weight Management   Basic metabolic panel   Hemoglobin A1c   BMI 70 and over, adult ([place])   Relevant Orders   Amb Ref to Medical Weight Management    Other Visit Diagnoses    Need for immunization against influenza       Relevant Orders   Flu Vaccine QUAD 36+ mos IM (Completed)     Patient was advised:  Continue with the diet plan by the nutritionist.   I have placed a referral in to the [facility] team- you will get a call to make an appt.   Advise a  diet rich in lean protein, nonfat low-fat dairy, vegetables, fruits, complex carbohydrates with aerobic exercise  goal of 30 min x 5 times a week and add in twice weekly weight resistance training.  Flu vaccine today.   Tdap at your pharmacy.   Please see your gynecologist  for PAP and birth control refills.   Please follow up with psychiatry as you plan and discuss Zoloft mental blunting effects.  Follow-up: Return in about 3 months (around 06/25/2020).  This visit occurred during the SARS-CoV-2 public health emergency.  Safety protocols were in place, including  screening questions prior to the visit, additional usage of staff PPE, and extensive cleaning of exam room while observing appropriate contact time as indicated for disinfecting solutions.    Denice Paradise, NP

## 2020-03-26 ENCOUNTER — Encounter: Payer: Self-pay | Admitting: Nurse Practitioner

## 2020-03-26 LAB — HEMOGLOBIN A1C: Hgb A1c MFr Bld: 5.9 % (ref 4.6–6.5)

## 2020-03-26 LAB — BASIC METABOLIC PANEL
BUN: 13 mg/dL (ref 6–23)
CO2: 26 mEq/L (ref 19–32)
Calcium: 9.1 mg/dL (ref 8.4–10.5)
Chloride: 100 mEq/L (ref 96–112)
Creatinine, Ser: 0.67 mg/dL (ref 0.40–1.20)
GFR: 117.06 mL/min (ref >60.00)
Glucose, Bld: 92 mg/dL (ref 70–99)
Potassium: 3.9 mEq/L (ref 3.5–5.1)
Sodium: 136 mEq/L (ref 135–145)

## 2020-03-28 ENCOUNTER — Encounter (INDEPENDENT_AMBULATORY_CARE_PROVIDER_SITE_OTHER): Payer: Self-pay

## 2020-03-30 NOTE — Progress Notes (Deleted)
Office Visit Note  Patient: Nancy Molina             Date of Birth: 14-Nov-1990           MRN: [id number]             PCP: Marval Regal, NP Referring: Marval Regal, NP Visit Date: 04/13/2020 Occupation: @GUAROCC @  Subjective:  No chief complaint on file.   History of Present Illness: Nancy Molina is a 29 y.o. female ***   Activities of Daily Living:  Patient reports morning stiffness for *** {minute/hour:19697}.   Patient {ACTIONS;DENIES/REPORTS:21021675::"Denies"} nocturnal pain.  Difficulty dressing/grooming: {ACTIONS;DENIES/REPORTS:21021675::"Denies"} Difficulty climbing stairs: {ACTIONS;DENIES/REPORTS:21021675::"Denies"} Difficulty getting out of chair: {ACTIONS;DENIES/REPORTS:21021675::"Denies"} Difficulty using hands for taps, buttons, cutlery, and/or writing: {ACTIONS;DENIES/REPORTS:21021675::"Denies"}  No Rheumatology ROS completed.   PMFS History:  Patient Active Problem List   Diagnosis Date Noted  . BMI 70 and over, adult ([place]) 03/25/2020  . Prediabetes 12/17/2019  . ESR raised 12/17/2019  . Umbilical pain 16/60/6301  . Umbilical hernia without obstruction and without gangrene 12/02/2019  . Leukocytosis 11/18/2019  . Anxiety with depression 10/22/2019  . Morbid obesity ([place]) 10/22/2019  . Annual physical exam 10/22/2019  . Myopia, bilateral 03/25/2019  . Myopia 02/08/2012    Past Medical History:  Diagnosis Date  . Anxiety   . Depression   . IBS (irritable bowel syndrome)   . Obesity   . Obsessive-compulsive disorder   . OCD (obsessive compulsive disorder)     Family History  Problem Relation Age of Onset  . Depression Mother   . Anxiety disorder Mother   . Alcohol abuse Mother   . Eating disorder Mother   . Obesity Father   . Heart disease Father   . Alcohol abuse Father   . Depression Father   . Polycystic ovary syndrome Sister   . Alcohol abuse Maternal Grandmother   . Alcohol abuse Paternal Grandfather   . Depression  Paternal Grandfather   . Colon cancer Maternal Grandfather    Past Surgical History:  Procedure Laterality Date  . DILATION AND CURETTAGE OF UTERUS    . TONSILLECTOMY AND ADENOIDECTOMY     Social History   Social History Narrative   Born in [place], later moved to [place]. She lives with husband and 78 yo daughter Willaim Rayas.   Two older sisters. 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   Immunization History  Administered Date(s) Administered  . Influenza,inj,Quad PF,6+ Mos 03/25/2020  . Influenza-Unspecified 02/14/2019     Objective: Vital Signs: There were no vitals taken for this visit.   Physical Exam   Musculoskeletal Exam: ***  CDAI Exam: CDAI Score: -- Patient Global: --; Provider Global: -- Swollen: --; Tender: -- Joint Exam 04/13/2020   No joint exam has been documented for this visit   There is currently no information documented on the homunculus. Go to the Rheumatology activity and complete the homunculus joint exam.  Investigation: No additional findings.  Imaging: No results found.  Recent Labs: Lab Results  Component Value Date   WBC 12.2 (H) 11/26/2019   HGB 13.1 11/26/2019   PLT 371 11/26/2019   NA 136 03/25/2020   K 3.9 03/25/2020   CL 100 03/25/2020   CO2 26 03/25/2020   GLUCOSE 92 03/25/2020   BUN 13 03/25/2020   CREATININE 0.67 03/25/2020   BILITOT 0.4 10/21/2019   ALKPHOS 94 10/21/2019   AST 12 10/21/2019   ALT  15 10/21/2019   PROT 7.2 10/21/2019   ALBUMIN 4.3 10/21/2019   CALCIUM 9.1 03/25/2020   GFRAA >60 06/29/2012    Speciality Comments: No specialty comments available.  Procedures:  No procedures performed Allergies: Clindamycin/lincomycin, Methocarbamol, and Clindamycin   Assessment / Plan:     Visit Diagnoses: Elevated sed rate - 12/17/19: ESR 51. ANA negative and ESR 33 on 11/26/19  Family history of lupus erythematosus  Family history of rheumatoid  arthritis  Orders: No orders of the defined types were placed in this encounter.  No orders of the defined types were placed in this encounter.   Face-to-face time spent with patient was *** minutes. Greater than 50% of time was spent in counseling and coordination of care.  Follow-Up Instructions: No follow-ups on file.   Ofilia Neas, PA-C  Note - This record has been created using Dragon software.  Chart creation errors have been sought, but may not always  have been located. Such creation errors do not reflect on  the standard of medical care.

## 2020-04-13 ENCOUNTER — Ambulatory Visit: Payer: BC Managed Care – PPO | Admitting: Rheumatology

## 2020-04-13 DIAGNOSIS — R700 Elevated erythrocyte sedimentation rate: Secondary | ICD-10-CM

## 2020-04-13 DIAGNOSIS — Z8261 Family history of arthritis: Secondary | ICD-10-CM

## 2020-04-13 DIAGNOSIS — Z840 Family history of diseases of the skin and subcutaneous tissue: Secondary | ICD-10-CM

## 2020-04-26 ENCOUNTER — Telehealth (HOSPITAL_COMMUNITY): Payer: BC Managed Care – PPO | Admitting: Psychiatry

## 2020-04-27 ENCOUNTER — Telehealth (INDEPENDENT_AMBULATORY_CARE_PROVIDER_SITE_OTHER): Payer: 59 | Admitting: Psychiatry

## 2020-04-27 ENCOUNTER — Other Ambulatory Visit: Payer: Self-pay

## 2020-04-27 DIAGNOSIS — F3341 Major depressive disorder, recurrent, in partial remission: Secondary | ICD-10-CM | POA: Diagnosis not present

## 2020-04-27 DIAGNOSIS — F422 Mixed obsessional thoughts and acts: Secondary | ICD-10-CM | POA: Diagnosis not present

## 2020-04-27 MED ORDER — SERTRALINE HCL 100 MG PO TABS: 300.0000 mg | ORAL_TABLET | Freq: Every day | ORAL | 1 refills | Status: DC

## 2020-04-27 NOTE — Progress Notes (Signed)
BH MD/PA/NP OP Progress Note  04/27/2020 3:35 PM Nancy Molina  MRN:  [id number] Interview was conducted using videoconferencing application and I verified that I was speaking with the correct person using two identifiers. I discussed the limitations of evaluation and management by telemedicine and  the availability of in person appointments. Patient expressed understanding and agreed to proceed. Participants in the visit: patient (location - home); physician (location - home office).  Chief Complaint: Increase in obsessive thinking.  HPI: 29 yo married white female with a long history of major depressive disorder, obsessive-compulsive disorder and binge eating disorderwho comes to establish regular psychiatric follow up.She had been under care of Dr. Henrietta Dine in 2017 who has increased previously prescribed sertraline to 200 mg and this brought about full resolution of depression and OCD symptoms. Two years ago she decided to wean self off sertraline and eventually her symptoms, primarily OCD, returned. Sheadmits to having constantobsessive thoughts(about safety mostly)and ritualistic behaviors such as checking doors, locks, windows andcounting.These behaviorsinterfere with her daily activities, cause distress and some depression.Her sleep which was poor in the past (and required taking trazodone) is fairly good and her bing eating is under much better control than it was few years ago. She was tried on Vyvanse 30 mg in the past but did not like how it made her feel.Patient has not been on any otherantidepressants besides sertraline.It has been restarted few weeks ago by her PCP at a 50 mg dose.Given good response to sertraline in the post we titrated dose up to 200 mg and her depression has practically resolved. She reports however that obsessive thoughts have perhaps intensified lately. Her sleep has improved - she only needs trazodone once a week if at all.    Visit Diagnosis:     ICD-10-CM   1. Mixed obsessional thoughts and acts  F42.2   2. Major depressive disorder, recurrent episode, in partial remission (HCC)  F33.41     Past Psychiatric History: Please see intake H&P.  Past Medical History:  Past Medical History:  Diagnosis Date  . Anxiety   . Depression   . IBS (irritable bowel syndrome)   . Obesity   . Obsessive-compulsive disorder   . OCD (obsessive compulsive disorder)     Past Surgical History:  Procedure Laterality Date  . DILATION AND CURETTAGE OF UTERUS    . TONSILLECTOMY AND ADENOIDECTOMY      Family Psychiatric History: Reviewed.  Family History:  Family History  Problem Relation Age of Onset  . Depression Mother   . Anxiety disorder Mother   . Alcohol abuse Mother   . Eating disorder Mother   . Obesity Father   . Heart disease Father   . Alcohol abuse Father   . Depression Father   . Polycystic ovary syndrome Sister   . Alcohol abuse Maternal Grandmother   . Alcohol abuse Paternal Grandfather   . Depression Paternal Grandfather   . Colon cancer Maternal Grandfather     Social History:  Social History   Socioeconomic History  . Marital status: Married    Spouse name: Not on file  . Number of children: 1  . Years of education: Not on file  . Highest education level: Not on file  Occupational History  . Occupation: homemaker  Tobacco Use  . Smoking status: Never Smoker  . Smokeless tobacco: Never Used  Vaping Use  . Vaping Use: Never used  Substance and Sexual Activity  . Alcohol use: No    Alcohol/week:  0.0 standard drinks  . Drug use: No  . Sexual activity: Yes    Birth control/protection: Pill  Other Topics Concern  . Not on file  Social History Narrative   Born in [place], later [place]. She lives with husband and 54 yo daughter Nancy Molina.   Two older sisters. 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   Social Determinants of Health   Financial  Resource Strain: Not on file  Food Insecurity: Not on file  Transportation Needs: Not on file  Physical Activity: Not on file  Stress: Not on file  Social Connections: Not on file    Allergies:  Allergies  Allergen Reactions  . Clindamycin/Lincomycin Shortness Of Breath  . Methocarbamol Shortness Of Breath  . Clindamycin Swelling    Metabolic Disorder Labs: Lab Results  Component Value Date   HGBA1C 5.9 03/25/2020   No results found for: PROLACTIN Lab Results  Component Value Date   CHOL 192 10/21/2019   TRIG 137.0 10/21/2019   HDL 46.10 10/21/2019   CHOLHDL 4 10/21/2019   VLDL 27.4 10/21/2019   LDLCALC 118 (H) 10/21/2019   Lab Results  Component Value Date   TSH 1.57 10/21/2019    Therapeutic Level Labs: No results found for: LITHIUM No results found for: VALPROATE No components found for:  CBMZ  Current Medications: Current Outpatient Medications  Medication Sig Dispense Refill  . desogestrel-ethinyl estradiol (APRI) 0.15-30 MG-MCG tablet TAKE 1 TABLET BY MOUTH EVERY DAY    . dicyclomine (BENTYL) 10 MG capsule Take 10 mg by mouth 4 (four) times daily -  before meals and at bedtime.    . Iron-Vitamin C 65-125 MG TABS Take 1 tablet by mouth daily. 90 tablet 1  . Loratadine 10 MG CAPS Take by mouth. Reported on 09/16/2015    . Multiple Vitamin (MULTIVITAMIN ADULT PO) Take by mouth.    . sertraline (ZOLOFT) 100 MG tablet Take 3 tablets (300 mg total) by mouth daily. 270 tablet 1  . traZODone (DESYREL) 50 MG tablet Take 1 tablet (50 mg total) by mouth at bedtime as needed for sleep. 90 tablet 1   No current facility-administered medications for this visit.     Psychiatric Specialty Exam: Review of Systems  Psychiatric/Behavioral: The patient is nervous/anxious.   All other systems reviewed and are negative.   There were no vitals taken for this visit.There is no height or weight on file to calculate BMI.  General Appearance: Casual and Well Groomed  Eye  Contact:  Good  Speech:  Clear and Coherent and Normal Rate  Volume:  Normal  Mood:  Anxious  Affect:  Constricted  Thought Process:  Goal Directed  Orientation:  Full (Time, Place, and Person)  Thought Content: Obsessions   Suicidal Thoughts:  No  Homicidal Thoughts:  No  Memory:  Immediate;   Good Recent;   Good Remote;   Good  Judgement:  Good  Insight:  Good  Psychomotor Activity:  Normal  Concentration:  Concentration: Good  Recall:  Good  Fund of Knowledge: Good  Language: Good  Akathisia:  Negative  Handed:  Right  AIMS (if indicated): not done  Assets:  Communication Skills Desire for Improvement Financial Resources/Insurance Housing Social Support Talents/Skills  ADL's:  Intact  Cognition: WNL  Sleep:  Fair   Screenings: GAD-7   Flowsheet Row Office Visit from 11/18/2019 in [facility]  Office Visit from 10/21/2019 in [facility]  [place]  Total GAD-7 Score 11 19    PHQ2-9   Flowsheet Row Nutrition from 01/29/2020 in [facility] [place] Office Visit from 11/18/2019 in [facility] Office Visit from 10/21/2019 in [facility]   PHQ-2 Total Score 2 2 4   PHQ-9 Total Score - 6 16       Assessment and Plan: 29 yo married white female with a long history of major depressive disorder, obsessive-compulsive disorder and binge eating disorderwho comes to establish regular psychiatric follow up.She had been under care of Dr. 26 in 2017 who has increased previously prescribed sertraline to 200 mg and this brought about full resolution of depression and OCD symptoms. Two years ago she decided to wean self off sertraline and eventually her symptoms, primarily OCD, returned. Sheadmits to having constantobsessive thoughts(about safety mostly)and ritualistic behaviors such as checking doors, locks, windows andcounting.These behaviorsinterfere with her daily activities, cause distress and some  depression.Her sleep which was poor in the past (and required taking trazodone) is fairly good and her bing eating is under much better control than it was few years ago. She was tried on Vyvanse 30 mg in the past but did not like how it made her feel.Patient has not been on any otherantidepressants besides sertraline.It has been restarted few weeks ago by her PCP at a 50 mg dose.Given good response to sertraline in the post we titrated dose up to 200 mg and her depression has practically resolved. She reports however that obsessive thoughts have perhaps intensified lately. Her sleep has improved - she only needs trazodone once a week if at all.   Dx: OCD; MDD recurrent in partial remission  Plan:. We will try further increasing sertraline dose to 300 mg and continue trazodone 50 mg prn insomnia. Next appointment in 2 months.The plan was discussed with patient who had an opportunity to ask questions and these were all answered. I spend71minutes in videoconferencingwith the patient.    12m, MD 04/27/2020, 3:35 PM

## 2020-05-04 ENCOUNTER — Ambulatory Visit: Payer: BC Managed Care – PPO | Admitting: Rheumatology

## 2020-05-24 ENCOUNTER — Other Ambulatory Visit: Payer: BC Managed Care – PPO

## 2020-05-25 ENCOUNTER — Telehealth: Payer: BC Managed Care – PPO | Admitting: Oncology

## 2020-05-27 ENCOUNTER — Other Ambulatory Visit: Payer: BC Managed Care – PPO

## 2020-05-27 ENCOUNTER — Telehealth (HOSPITAL_COMMUNITY): Payer: Self-pay

## 2020-05-27 ENCOUNTER — Ambulatory Visit: Payer: BC Managed Care – PPO | Admitting: Oncology

## 2020-05-27 NOTE — Telephone Encounter (Signed)
[employer] PRESCRIPTION COVERAGE APPROVED  SERTRALINE 100MG  TABLET CASE # EFFECTIVE 05/25/2020 TO 05/25/2021  S/W [facility]ANTEL NOTIFIED PHARMACY & PT IS SET UP FOR TEXT NOTIFICATION WHEN MEDICATION IS READY FOR PICK UP

## 2020-05-31 ENCOUNTER — Telehealth: Payer: Self-pay | Admitting: Oncology

## 2020-05-31 NOTE — Telephone Encounter (Signed)
Pt woke with COVID symptoms and needs to reschedule 06/01/20 and 06/02/20 appts.  Cancelled appts and routing to scheduler for follow-up.

## 2020-06-01 ENCOUNTER — Inpatient Hospital Stay: Payer: Managed Care, Other (non HMO)

## 2020-06-01 NOTE — Telephone Encounter (Signed)
FYI.Marland KitchenMarland KitchenMarland KitchenMarland KitchenMarland Kitchen  A detailed message was left on pts VM to contact the office to have her 06/01/20 lab and her 06/02/20 Mychart appts R/S for a later date.

## 2020-06-02 ENCOUNTER — Inpatient Hospital Stay: Payer: Managed Care, Other (non HMO) | Admitting: Oncology

## 2020-06-02 ENCOUNTER — Telehealth: Payer: Self-pay | Admitting: Oncology

## 2020-06-02 NOTE — Telephone Encounter (Signed)
Pt called to reschedule appts for 1/18, 1/19 please call pt to reschedule.

## 2020-06-04 ENCOUNTER — Telehealth: Payer: Self-pay | Admitting: Oncology

## 2020-06-04 NOTE — Telephone Encounter (Signed)
Please call pt to r/s appt.

## 2020-06-04 NOTE — Telephone Encounter (Signed)
06/04/2020 Called pt to r/s missed appts on 1/18 and 1/19. New appts made for 06/29/20 and 06/30/20. Pt confirmed these changes  SRW

## 2020-06-22 ENCOUNTER — Other Ambulatory Visit (HOSPITAL_COMMUNITY): Payer: Self-pay | Admitting: Psychiatry

## 2020-06-28 NOTE — Progress Notes (Signed)
Office Visit Note  Patient: Nancy Molina             Date of Birth: [birth year]           MRN: [id number]             PCP: Marval Regal, NP Referring: Marval Regal, NP Visit Date: 07/12/2020 Occupation: @GUAROCC @  Subjective:  Pain in multiple joints.   History of Present Illness: Nancy Molina is a 30 y.o. female seen in consultation per request of her PCP for elevated sedimentation rate and joint pain.  According the patient at age 41 she started noticing pain and discomfort in her entire spine.  She states the pain was in her neck upper and lower back.  The pain persist over the years.  She states she has episodes about 4-5 times a year when she cannot even get out of bed.  Most the time she lives in chronic pain.  She describes the pain on the scale of 0-10 about 5.  She denies any history of joint swelling.  She has been also diagnosed with carpal tunnel syndrome.  She has some discomfort in her hips knees and her feet.  She denies any history of plantar fasciitis or Achilles tendinitis.  She has noticed some redness on her hands but no definite rash.  There is no history of psoriasis.  There is positive family history of Crohn's disease in her maternal uncle.  She is gravida 1, para 1, miscarriages 0.  She delivered her child in 2014.  Activities of Daily Living:  Patient reports morning stiffness for 30 minutes.   Patient Reports nocturnal pain.  Difficulty dressing/grooming: Denies Difficulty climbing stairs: Reports Difficulty getting out of chair: Reports Difficulty using hands for taps, buttons, cutlery, and/or writing: Denies  Review of Systems  Constitutional: Positive for fatigue. Negative for night sweats, weight gain and weight loss.  HENT: Positive for mouth sores and mouth dryness. Negative for trouble swallowing, trouble swallowing and nose dryness.        Fever blisters  Eyes: Negative for pain, redness, itching, visual disturbance and dryness.   Respiratory: Negative for cough, hemoptysis, shortness of breath and difficulty breathing.   Cardiovascular: Positive for palpitations. Negative for chest pain, hypertension, irregular heartbeat and swelling in legs/feet.       Related to anxiety.  Its been better since she has been on sertraline.  Gastrointestinal: Positive for constipation. Negative for abdominal pain, blood in stool and diarrhea.  Endocrine: Negative for increased urination.  Genitourinary: Negative for painful urination and vaginal dryness.  Musculoskeletal: Positive for arthralgias, joint pain, myalgias, morning stiffness, muscle tenderness and myalgias. Negative for joint swelling and muscle weakness.  Skin: Positive for color change, rash and sensitivity to sunlight. Negative for hair loss, redness, skin tightness and ulcers.  Allergic/Immunologic: Positive for susceptible to infections.  Neurological: Positive for headaches. Negative for dizziness, numbness, memory loss, night sweats and weakness.  Hematological: Negative for swollen glands.  Psychiatric/Behavioral: Positive for depressed mood and sleep disturbance. Negative for confusion. The patient is nervous/anxious.     PMFS History:  Patient Active Problem List   Diagnosis Date Noted  . Iron deficiency 07/01/2020  . BMI 70 and over, adult ([place]) 03/25/2020  . Prediabetes 12/17/2019  . ESR raised 12/17/2019  . Umbilical pain 73/41/9379  . Umbilical hernia without obstruction and without gangrene 12/02/2019  . Leukocytosis 11/18/2019  . Anxiety with depression 10/22/2019  . Morbid obesity ([place]) 10/22/2019  .  Annual physical exam 10/22/2019  . Myopia, bilateral 03/25/2019  . Myopia 02/08/2012    Past Medical History:  Diagnosis Date  . Anxiety   . Depression   . IBS (irritable bowel syndrome)   . Obesity   . Obsessive-compulsive disorder   . OCD (obsessive compulsive disorder)     Family History  Problem Relation Age of Onset  . Depression Mother    . Anxiety disorder Mother   . Alcohol abuse Mother   . Eating disorder Mother   . Obesity Father   . Heart disease Father   . Alcohol abuse Father   . Depression Father   . Polycystic ovary syndrome Sister   . Alcohol abuse Maternal Grandmother   . Rheum arthritis Maternal Grandmother   . Alcohol abuse Paternal Grandfather   . Depression Paternal Grandfather   . Colon cancer Maternal Grandfather   . Rheum arthritis Maternal Aunt   . Healthy Daughter    Past Surgical History:  Procedure Laterality Date  . DILATION AND CURETTAGE OF UTERUS    . TONSILLECTOMY AND ADENOIDECTOMY     Social History   Social History Narrative   Born in [place], later moved to [place]. She lives with husband and 66 yo daughter Willaim Rayas.   Two older sisters. 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   Immunization History  Administered Date(s) Administered  . Influenza,inj,Quad PF,6+ Mos 03/25/2020  . Influenza-Unspecified 02/14/2019     Objective: Vital Signs: BP 135/86 (BP Location: Right Arm, Patient Position: Sitting, Cuff Size: Normal)   Pulse 97   Ht $R'5\' 5"'mM$  (1.651 m)   Wt (!) 418 lb (189.6 kg)   BMI 69.56 kg/m    Physical Exam Vitals and nursing note reviewed.  Constitutional:      Appearance: She is well-developed and well-nourished.  HENT:     Head: Normocephalic and atraumatic.  Eyes:     Extraocular Movements: EOM normal.     Conjunctiva/sclera: Conjunctivae normal.  Cardiovascular:     Rate and Rhythm: Normal rate and regular rhythm.     Pulses: Intact distal pulses.     Heart sounds: Normal heart sounds.  Pulmonary:     Effort: Pulmonary effort is normal.     Breath sounds: Normal breath sounds.  Abdominal:     General: Bowel sounds are normal.     Palpations: Abdomen is soft.  Musculoskeletal:     Cervical back: Normal range of motion.  Lymphadenopathy:     Cervical: No cervical adenopathy.  Skin:    General: Skin  is warm and dry.     Capillary Refill: Capillary refill takes less than 2 seconds.     Comments: Dry skin was noted between her fingers and on her plantar surface.  Neurological:     Mental Status: She is alert and oriented to person, place, and time.  Psychiatric:        Mood and Affect: Mood and affect normal.        Behavior: Behavior normal.      Musculoskeletal Exam: C-spine was in good range of motion.  There was no tenderness over C-spine or thoracic spine.  She had tenderness over the lumbar region.  There was no SI joint tenderness.  Shoulder joints, elbow joints, wrist joints, MCPs PIPs and DIPs with good range of motion with no synovitis.  Hip joints, knee joints, ankles with good range of motion.  She has bilateral pes cavus.  CDAI Exam: CDAI Score: -- Patient Global: --; Provider Global: -- Swollen: --; Tender: -- Joint Exam 07/12/2020   No joint exam has been documented for this visit   There is currently no information documented on the homunculus. Go to the Rheumatology activity and complete the homunculus joint exam.  Investigation: No additional findings.  Imaging: No results found.  Recent Labs: Lab Results  Component Value Date   WBC 11.0 (H) 06/29/2020   HGB 12.9 06/29/2020   PLT 312 06/29/2020   NA 136 03/25/2020   K 3.9 03/25/2020   CL 100 03/25/2020   CO2 26 03/25/2020   GLUCOSE 92 03/25/2020   BUN 13 03/25/2020   CREATININE 0.67 03/25/2020   BILITOT 0.4 10/21/2019   ALKPHOS 94 10/21/2019   AST 12 10/21/2019   ALT 15 10/21/2019   PROT 7.2 10/21/2019   ALBUMIN 4.3 10/21/2019   CALCIUM 9.1 03/25/2020   GFRAA >60 06/29/2012   November 26, 2019 hepatitis A, hepatitis B and hepatitis C nonreactive October 21, 2019 HIV negative, TSH normal, vitamin D normal  Speciality Comments: No specialty comments available.  Procedures:  No procedures performed Allergies: Clindamycin/lincomycin, Methocarbamol, and Clindamycin   Assessment / Plan:     Visit  Diagnoses: Elevated sed rate - 11/18/19: ESR 33, CRP 3.1, 11/26/19: ANA-, 12/17/19: ESR 51 -patient had no synovitis on examination.  She gives history of chronic pain in her entire spine.  It is not unusual to see elevated sedimentation rate with high BMI.  To complete the work-up I will obtain following labs.  Plan: Rheumatoid factor, Cyclic citrul peptide antibody, IgG  Chronic midline low back pain without sciatica -she complains of discomfort in her entire spine since she was a small child.  She has episodic increased pain.  The mobility in her spine was difficult to assess due to her body habitus.  She had good range of motion of her cervical spine.  She had no point tenderness over cervical or thoracic region.  She is some tenderness over the  lumbar region.  She denies any history of plantar fasciitis, Achilles tendinitis, iritis.  There is family history of Crohn's disease in her maternal uncle.  I did not notice any psoriasis.  Plan: HLA-B27 antigen.  I will obtain x-ray of lumbar spine 2 views at the hospital.A handout on back exercises was given.  Chronic SI joint pain -she complains of discomfort in the bilateral SI joints.  No SI joint tenderness was noted.  Plan: HLA-B27 antigen.  I will obtain x-ray of SI joints at the hospital to rule out sacroiliitis.  Pes cavus-she has bilateral pes cavus.  Use of shoes with arch support was discussed.  Rash-dry skin was noted between the finger and on the plantar surface of her feet.  There is no evidence of psoriasis.  There is a family history of psoriasis.  Other medical problems are listed as follows:  Prediabetes  Anxiety with depression  Umbilical hernia without obstruction and without gangrene  Iron deficiency - Followed by a hematologist.  She is on iron supplement.  History of migraine  Myopia, bilateral  Family history of lupus erythematosus  Family history of rheumatoid arthritis  Orders: Orders Placed This Encounter   Procedures  . DG Lumbar Spine 2-3 Views  . DG Si Joints  . Rheumatoid factor  . Cyclic citrul peptide antibody, IgG  . HLA-B27 antigen   No orders of the defined types were placed in this encounter.   Follow-Up Instructions:  Return for Lower back pain.   Bo Merino, MD  Note - This record has been created using Editor, commissioning.  Chart creation errors have been sought, but may not always  have been located. Such creation errors do not reflect on  the standard of medical care.

## 2020-06-29 ENCOUNTER — Inpatient Hospital Stay: Payer: Managed Care, Other (non HMO) | Attending: Oncology

## 2020-06-29 ENCOUNTER — Telehealth (HOSPITAL_COMMUNITY): Payer: 59 | Admitting: Psychiatry

## 2020-06-29 DIAGNOSIS — D509 Iron deficiency anemia, unspecified: Secondary | ICD-10-CM | POA: Diagnosis not present

## 2020-06-29 DIAGNOSIS — D72829 Elevated white blood cell count, unspecified: Secondary | ICD-10-CM

## 2020-06-29 LAB — CBC WITH DIFFERENTIAL/PLATELET
Abs Immature Granulocytes: 0.03 10*3/uL (ref 0.00–0.07)
Basophils Absolute: 0 10*3/uL (ref 0.0–0.1)
Basophils Relative: 0 %
Eosinophils Absolute: 0.3 10*3/uL (ref 0.0–0.5)
Eosinophils Relative: 3 %
HCT: 40.3 % (ref 36.0–46.0)
Hemoglobin: 12.9 g/dL (ref 12.0–15.0)
Immature Granulocytes: 0 %
Lymphocytes Relative: 22 %
Lymphs Abs: 2.4 10*3/uL (ref 0.7–4.0)
MCH: 24.6 pg — ABNORMAL LOW (ref 26.0–34.0)
MCHC: 32 g/dL (ref 30.0–36.0)
MCV: 76.9 fL — ABNORMAL LOW (ref 80.0–100.0)
Monocytes Absolute: 0.7 10*3/uL (ref 0.1–1.0)
Monocytes Relative: 6 %
Neutro Abs: 7.6 10*3/uL (ref 1.7–7.7)
Neutrophils Relative %: 69 %
Platelets: 312 10*3/uL (ref 150–400)
RBC: 5.24 MIL/uL — ABNORMAL HIGH (ref 3.87–5.11)
RDW: 15.1 % (ref 11.5–15.5)
WBC: 11 10*3/uL — ABNORMAL HIGH (ref 4.0–10.5)
nRBC: 0 % (ref 0.0–0.2)

## 2020-06-29 LAB — IRON AND TIBC
Iron: 26 ug/dL — ABNORMAL LOW (ref 28–170)
Saturation Ratios: 6 % — ABNORMAL LOW (ref 10.4–31.8)
TIBC: 410 ug/dL (ref 250–450)
UIBC: 384 ug/dL

## 2020-06-29 LAB — FERRITIN: Ferritin: 52 ng/mL (ref 11–307)

## 2020-06-30 ENCOUNTER — Encounter: Payer: Self-pay | Admitting: Oncology

## 2020-06-30 ENCOUNTER — Telehealth: Payer: Self-pay | Admitting: Oncology

## 2020-06-30 ENCOUNTER — Inpatient Hospital Stay (HOSPITAL_BASED_OUTPATIENT_CLINIC_OR_DEPARTMENT_OTHER): Payer: Managed Care, Other (non HMO) | Admitting: Oncology

## 2020-06-30 DIAGNOSIS — R718 Other abnormality of red blood cells: Secondary | ICD-10-CM

## 2020-06-30 DIAGNOSIS — E611 Iron deficiency: Secondary | ICD-10-CM

## 2020-06-30 DIAGNOSIS — D72829 Elevated white blood cell count, unspecified: Secondary | ICD-10-CM

## 2020-06-30 NOTE — Progress Notes (Signed)
HEMATOLOGY-ONCOLOGY TeleHEALTH VISIT PROGRESS NOTE  I connected with Nancy Molina on 06/30/20  at  2:45 PM EST by video enabled telemedicine visit and verified that I am speaking with the correct person using two identifiers. I discussed the limitations, risks, security and privacy concerns of performing an evaluation and management service by telemedicine and the availability of in-person appointments. The patient expressed understanding and agreed to proceed.   Other persons participating in the visit and their role in the encounter:  None  Patient's location: Home  Provider's location: office Chief Complaint: Follow-up leukocytosis, microcytosis/ iron deficiency   INTERVAL HISTORY Nancy Molina is a 30 y.o. female who has above history reviewed by me today presents for follow up visit  Problems and complaints are listed below:  Patient reports no new symptoms.  Patient takes Vitron-C daily.  Tolerated well.  Review of Systems  Constitutional: Negative for appetite change, chills, fatigue and fever.  HENT:   Negative for hearing loss and voice change.   Eyes: Negative for eye problems.  Respiratory: Negative for chest tightness and cough.   Cardiovascular: Negative for chest pain.  Gastrointestinal: Negative for abdominal distention, abdominal pain and blood in stool.  Endocrine: Negative for hot flashes.  Genitourinary: Negative for difficulty urinating and frequency.   Musculoskeletal: Positive for arthralgias.  Skin: Negative for itching and rash.  Neurological: Negative for extremity weakness.  Hematological: Negative for adenopathy.  Psychiatric/Behavioral: Negative for confusion.    Past Medical History:  Diagnosis Date  . Anxiety   . Depression   . IBS (irritable bowel syndrome)   . Obesity   . Obsessive-compulsive disorder   . OCD (obsessive compulsive disorder)    Past Surgical History:  Procedure Laterality Date  . DILATION AND CURETTAGE OF UTERUS     . TONSILLECTOMY AND ADENOIDECTOMY      Family History  Problem Relation Age of Onset  . Depression Mother   . Anxiety disorder Mother   . Alcohol abuse Mother   . Eating disorder Mother   . Obesity Father   . Heart disease Father   . Alcohol abuse Father   . Depression Father   . Polycystic ovary syndrome Sister   . Alcohol abuse Maternal Grandmother   . Alcohol abuse Paternal Grandfather   . Depression Paternal Grandfather   . Colon cancer Maternal Grandfather     Social History   Socioeconomic History  . Marital status: Married    Spouse name: Not on file  . Number of children: 1  . Years of education: Not on file  . Highest education level: Not on file  Occupational History  . Occupation: homemaker  Tobacco Use  . Smoking status: Never Smoker  . Smokeless tobacco: Never Used  Vaping Use  . Vaping Use: Never used  Substance and Sexual Activity  . Alcohol use: No    Alcohol/week: 0.0 standard drinks  . Drug use: No  . Sexual activity: Yes    Birth control/protection: Pill  Other Topics Concern  . Not on file  Social History Narrative   Born in [place], later moved to [place]. She lives with husband and 36 yo daughter Willaim Rayas.   Two older sisters. 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   Social Determinants of Health   Financial Resource Strain: Not on file  Food Insecurity: Not on file  Transportation Needs: Not on file  Physical Activity: Not on file  Stress: Not  on file  Social Connections: Not on file  Intimate Partner Violence: Not on file    Current Outpatient Medications on File Prior to Visit  Medication Sig Dispense Refill  . desogestrel-ethinyl estradiol (APRI) 0.15-30 MG-MCG tablet TAKE 1 TABLET BY MOUTH EVERY DAY    . Iron-Vitamin C 65-125 MG TABS Take 1 tablet by mouth daily. 90 tablet 1  . Loratadine 10 MG CAPS Take by mouth. Reported on 09/16/2015    . Multiple Vitamin (MULTIVITAMIN ADULT  PO) Take by mouth.    . sertraline (ZOLOFT) 100 MG tablet Take 3 tablets (300 mg total) by mouth daily. 270 tablet 1  . traZODone (DESYREL) 50 MG tablet TAKE 1 TABLET (50 MG TOTAL) BY MOUTH AT BEDTIME AS NEEDED FOR SLEEP. 90 tablet 1  . dicyclomine (BENTYL) 10 MG capsule Take 10 mg by mouth 4 (four) times daily -  before meals and at bedtime. (Patient not taking: Reported on 06/30/2020)     No current facility-administered medications on file prior to visit.    Allergies  Allergen Reactions  . Clindamycin/Lincomycin Shortness Of Breath  . Methocarbamol Shortness Of Breath  . Clindamycin Swelling       Observations/Objective: Today's Vitals   06/30/20 1431  PainSc: 0-No pain   There is no height or weight on file to calculate BMI.  Physical Exam Neurological:     Mental Status: She is alert.     CBC    Component Value Date/Time   WBC 11.0 (H) 06/29/2020 1524   RBC 5.24 (H) 06/29/2020 1524   HGB 12.9 06/29/2020 1524   HGB 12.8 11/06/2019 0859   HCT 40.3 06/29/2020 1524   HCT 39.4 11/06/2019 0859   PLT 312 06/29/2020 1524   PLT 256 06/29/2012 1059   MCV 76.9 (L) 06/29/2020 1524   MCV 78 (L) 11/06/2019 0859   MCV 78 (L) 06/29/2012 1059   MCH 24.6 (L) 06/29/2020 1524   MCHC 32.0 06/29/2020 1524   RDW 15.1 06/29/2020 1524   RDW 14.5 11/06/2019 0859   RDW 15.0 (H) 06/29/2012 1059   LYMPHSABS 2.4 06/29/2020 1524   LYMPHSABS 2.1 11/06/2019 0859   MONOABS 0.7 06/29/2020 1524   EOSABS 0.3 06/29/2020 1524   EOSABS 0.2 11/06/2019 0859   BASOSABS 0.0 06/29/2020 1524   BASOSABS 0.0 11/06/2019 0859    CMP     Component Value Date/Time   NA 136 03/25/2020 1632   NA 135 (L) 06/29/2012 1059   K 3.9 03/25/2020 1632   K 3.7 06/29/2012 1059   CL 100 03/25/2020 1632   CL 105 06/29/2012 1059   CO2 26 03/25/2020 1632   CO2 21 06/29/2012 1059   GLUCOSE 92 03/25/2020 1632   GLUCOSE 106 (H) 06/29/2012 1059   BUN 13 03/25/2020 1632   BUN 6 (L) 06/29/2012 1059   CREATININE 0.67  03/25/2020 1632   CREATININE 0.50 (L) 06/29/2012 1059   CALCIUM 9.1 03/25/2020 1632   CALCIUM 9.2 06/29/2012 1059   PROT 7.2 10/21/2019 1206   PROT 7.4 06/29/2012 1059   ALBUMIN 4.3 10/21/2019 1206   ALBUMIN 3.3 (L) 06/29/2012 1059   AST 12 10/21/2019 1206   AST 25 06/29/2012 1059   ALT 15 10/21/2019 1206   ALT 37 06/29/2012 1059   ALKPHOS 94 10/21/2019 1206   ALKPHOS 92 06/29/2012 1059   BILITOT 0.4 10/21/2019 1206   BILITOT 0.4 06/29/2012 1059   GFRNONAA >60 06/29/2012 1059   GFRAA >60 06/29/2012 1059     Assessment  and Plan: 1. Leukocytosis, unspecified type   2. RBC microcytosis   3. Iron deficiency     #Chronic leukocytosis, stable.  Likely reactive in nature.  Monitor. Previous work-up included negative hepatitis panel, negative ANA, elevated ESR indicating chronic inflammation.  Check flow cytometry at the next visit.  #RBC microcytosis and iron deficiency. Patient has been on oral iron supplementation and iron has further decreased. Plan IV iron with Venofer 229m weekly x 2 doses. Allergy reactions/infusion reaction including anaphylactic reaction discussed with patient. Other side effects include but not limited to high blood pressure, skin rash, weight gain, leg swelling, etc. discussed with patient that IV Venofer treatment is not recommended during first trimester.  Will check urine hCG prior to first treatment.  Patient voices understanding and willing to proceed.    Follow Up Instructions: Follow-up 6 months.   I discussed the assessment and treatment plan with the patient. The patient was provided an opportunity to ask questions and all were answered. The patient agreed with the plan and demonstrated an understanding of the instructions.  The patient was advised to call back or seek an in-person evaluation if the symptoms worsen or if the condition fails to improve as anticipated.   ZEarlie Server MD 06/30/2020 9:55 PM

## 2020-06-30 NOTE — Progress Notes (Signed)
Patient called/ pre- screened for virtual appoinment today with oncologist. No concerns at this time.   

## 2020-06-30 NOTE — Telephone Encounter (Signed)
Patient returning phone call in regards to virtual visit today. Requests a call back no earlier than 1:30 as she is still at work and will not be available prior.

## 2020-06-30 NOTE — Telephone Encounter (Signed)
Appt is scheudled for 2:45.  Will return her call to check in for virtual visit closer to the appt time.

## 2020-07-01 ENCOUNTER — Encounter: Payer: Self-pay | Admitting: Oncology

## 2020-07-01 ENCOUNTER — Telehealth: Payer: Self-pay | Admitting: *Deleted

## 2020-07-01 DIAGNOSIS — E611 Iron deficiency: Secondary | ICD-10-CM | POA: Insufficient documentation

## 2020-07-01 NOTE — Telephone Encounter (Signed)
FYI...  pt called stating that she was given the option to take oral supplements or NEW Venofer. she said that she wanted to cx her sched 07/02/20 lab (HCG) and both venofer appts that I sched per 06/30/20 los. Appts were cx per pt request

## 2020-07-01 NOTE — Addendum Note (Signed)
Addended by: Rickard Patience on: 07/01/2020 10:52 AM   Modules accepted: Orders

## 2020-07-02 ENCOUNTER — Inpatient Hospital Stay: Payer: Managed Care, Other (non HMO)

## 2020-07-02 NOTE — Telephone Encounter (Signed)
MD aware and agrees with patient's plan.

## 2020-07-07 ENCOUNTER — Ambulatory Visit: Payer: Managed Care, Other (non HMO)

## 2020-07-08 ENCOUNTER — Other Ambulatory Visit: Payer: Self-pay

## 2020-07-08 ENCOUNTER — Telehealth (INDEPENDENT_AMBULATORY_CARE_PROVIDER_SITE_OTHER): Payer: 59 | Admitting: Psychiatry

## 2020-07-08 DIAGNOSIS — F422 Mixed obsessional thoughts and acts: Secondary | ICD-10-CM | POA: Diagnosis not present

## 2020-07-08 DIAGNOSIS — F3342 Major depressive disorder, recurrent, in full remission: Secondary | ICD-10-CM | POA: Diagnosis not present

## 2020-07-08 NOTE — Progress Notes (Signed)
BH MD/PA/NP OP Progress Note  07/08/2020 3:08 PM Nancy Molina  MRN:  [id number] Interview was conducted using videoconferencing application and I verified that I was speaking with the correct person using two identifiers. I discussed the limitations of evaluation and management by telemedicine and  the availability of in person appointments. Patient expressed understanding and agreed to proceed. Participants in the visit: patient (location - home); physician (location - home office).  Chief Complaint: "I am doing really well".  HPI: 30 yo married female with a long history of major depressive disorder, obsessive-compulsive disorder and binge eating disorderwho comes to establish regular psychiatric follow up.She had been under care of Dr. Henrietta Dine in 2017 who has increased previously prescribed sertraline to 200 mg and this brought about full resolution of depression and OCD symptoms. Three years ago she decided to wean self off sertraline and eventually her symptoms, primarily OCD, returned. Shewas having constantobsessive thoughts(about safety mostly)and ritualistic behaviors such as checking doors, locks, windows andcounting.These behaviorsinterfered with her daily activities, cause distress and some depression.Her sleep which was poor in the past (and required taking trazodone) is fairly good and her binge eating is under much better control than it was few years ago. She was tried on Vyvanse 30 mg in the past but did not like how it made her feel.Patient has not been on any otherantidepressants besides sertraline.It has been restarted and gradually titratedto 200 mg and her depression has resolved. She reported however that obsessive thoughts have improved but were still present. In December last year we have therefore increase sertraline to 300 mg and OCD symptoms resolved. This high dose is well tolerated. Her sleep has improved - she has not used  trazodone in several weeks.    . Visit Diagnosis:    ICD-10-CM   1. Mixed obsessional thoughts and acts  F42.2   2. Major depressive disorder, recurrent episode, in full remission (HCC)  F33.42     Past Psychiatric History: Please see intake H&P.  Past Medical History:  Past Medical History:  Diagnosis Date  . Anxiety   . Depression   . IBS (irritable bowel syndrome)   . Obesity   . Obsessive-compulsive disorder   . OCD (obsessive compulsive disorder)     Past Surgical History:  Procedure Laterality Date  . DILATION AND CURETTAGE OF UTERUS    . TONSILLECTOMY AND ADENOIDECTOMY      Family Psychiatric History: Reviewed.  Family History:  Family History  Problem Relation Age of Onset  . Depression Mother   . Anxiety disorder Mother   . Alcohol abuse Mother   . Eating disorder Mother   . Obesity Father   . Heart disease Father   . Alcohol abuse Father   . Depression Father   . Polycystic ovary syndrome Sister   . Alcohol abuse Maternal Grandmother   . Alcohol abuse Paternal Grandfather   . Depression Paternal Grandfather   . Colon cancer Maternal Grandfather     Social History:  Social History   Socioeconomic History  . Marital status: Married    Spouse name: Not on file  . Number of children: 1  . Years of education: Not on file  . Highest education level: Not on file  Occupational History  . Occupation: homemaker  Tobacco Use  . Smoking status: Never Smoker  . Smokeless tobacco: Never Used  Vaping Use  . Vaping Use: Never used  Substance and Sexual Activity  . Alcohol use: No  Alcohol/week: 0.0 standard drinks  . Drug use: No  . Sexual activity: Yes    Birth control/protection: Pill  Other Topics Concern  . Not on file  Social History Narrative   Born in [place], later [place]. She lives with husband and 31 yo daughter Nancy Molina.   Two older sisters. 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   Social  Determinants of Health   Financial Resource Strain: Not on file  Food Insecurity: Not on file  Transportation Needs: Not on file  Physical Activity: Not on file  Stress: Not on file  Social Connections: Not on file    Allergies:  Allergies  Allergen Reactions  . Clindamycin/Lincomycin Shortness Of Breath  . Methocarbamol Shortness Of Breath  . Clindamycin Swelling    Metabolic Disorder Labs: Lab Results  Component Value Date   HGBA1C 5.9 03/25/2020   No results found for: PROLACTIN Lab Results  Component Value Date   CHOL 192 10/21/2019   TRIG 137.0 10/21/2019   HDL 46.10 10/21/2019   CHOLHDL 4 10/21/2019   VLDL 27.4 10/21/2019   LDLCALC 118 (H) 10/21/2019   Lab Results  Component Value Date   TSH 1.57 10/21/2019    Therapeutic Level Labs: No results found for: LITHIUM No results found for: VALPROATE No components found for:  CBMZ  Current Medications: Current Outpatient Medications  Medication Sig Dispense Refill  . desogestrel-ethinyl estradiol (APRI) 0.15-30 MG-MCG tablet TAKE 1 TABLET BY MOUTH EVERY DAY    . dicyclomine (BENTYL) 10 MG capsule Take 10 mg by mouth 4 (four) times daily -  before meals and at bedtime. (Patient not taking: Reported on 06/30/2020)    . Iron-Vitamin C 65-125 MG TABS Take 1 tablet by mouth daily. 90 tablet 1  . Loratadine 10 MG CAPS Take by mouth. Reported on 09/16/2015    . Multiple Vitamin (MULTIVITAMIN ADULT PO) Take by mouth.    . sertraline (ZOLOFT) 100 MG tablet Take 3 tablets (300 mg total) by mouth daily. 270 tablet 1  . traZODone (DESYREL) 50 MG tablet TAKE 1 TABLET (50 MG TOTAL) BY MOUTH AT BEDTIME AS NEEDED FOR SLEEP. 90 tablet 1   No current facility-administered medications for this visit.     Psychiatric Specialty Exam: Review of Systems  All other systems reviewed and are negative.   There were no vitals taken for this visit.There is no height or weight on file to calculate BMI.  General Appearance: Casual  Eye  Contact:  Good  Speech:  Clear and Coherent and Normal Rate  Volume:  Normal  Mood:  Euthymic  Affect:  Full Range  Thought Process:  Goal Directed  Orientation:  Full (Time, Place, and Person)  Thought Content: Logical   Suicidal Thoughts:  No  Homicidal Thoughts:  No  Memory:  Immediate;   Good Recent;   Good Remote;   Good  Judgement:  Good  Insight:  Good  Psychomotor Activity:  Normal  Concentration:  Concentration: Good  Recall:  Good  Fund of Knowledge: Good  Language: Good  Akathisia:  Negative  Handed:  Right  AIMS (if indicated): not done  Assets:  Communication Skills Desire for Improvement Financial Resources/Insurance Housing Social Support Talents/Skills  ADL's:  Intact  Cognition: WNL  Sleep:  Good   Screenings: GAD-7   Flowsheet Row Office Visit from 11/18/2019 in [facility] [place] Office Visit from 10/21/2019 in [facility]  Total GAD-7 Score 11 19    PHQ2-9   Flowsheet Row Nutrition from 01/29/2020 in [facility] [place] Office Visit from 11/18/2019 in [facility] Office Visit from 10/21/2019 in [facility] [place]  PHQ-2 Total Score 2 2 4   PHQ-9 Total Score - 6 16       Assessment and Plan: 30 yo married female with a long history of major depressive disorder, obsessive-compulsive disorder and binge eating disorderwho comes to establish regular psychiatric follow up.She had been under care of Dr. 26 in 2017 who has increased previously prescribed sertraline to 200 mg and this brought about full resolution of depression and OCD symptoms. Three years ago she decided to wean self off sertraline and eventually her symptoms, primarily OCD, returned. Shewas having constantobsessive thoughts(about safety mostly)and ritualistic behaviors such as checking doors, locks, windows andcounting.These behaviorsinterfered with her daily activities, cause distress and some  depression.Her sleep which was poor in the past (and required taking trazodone) is fairly good and her binge eating is under much better control than it was few years ago. She was tried on Vyvanse 30 mg in the past but did not like how it made her feel.Patient has not been on any otherantidepressants besides sertraline.It has been restarted and gradually titratedto 200 mg and her depression has resolved. She reported however that obsessive thoughts have improved but were still present. In December last year we have therefore increase sertraline to 300 mg and OCD symptoms resolved. This high dose is well tolerated. Her sleep has improved - she has not used  trazodone in several weeks.   Dx: OCD; MDD recurrentin remission  Plan:.We will continue sertraline 300 mg and trazodone 50 mg prn insomnia (not needed lately).Next appointment in3 months with anew provider.The plan was discussed with patient who had an opportunity to ask questions and these were all answered. I spend35minutes in videoconferencingwith the patient.   12m, MD 07/08/2020, 3:08 PM

## 2020-07-12 ENCOUNTER — Encounter: Payer: Self-pay | Admitting: Rheumatology

## 2020-07-12 ENCOUNTER — Other Ambulatory Visit: Payer: Self-pay

## 2020-07-12 ENCOUNTER — Ambulatory Visit: Payer: Managed Care, Other (non HMO) | Admitting: Rheumatology

## 2020-07-12 VITALS — BP 135/86 | HR 97 | Ht 65.0 in | Wt >= 6400 oz

## 2020-07-12 DIAGNOSIS — Z8261 Family history of arthritis: Secondary | ICD-10-CM

## 2020-07-12 DIAGNOSIS — G8929 Other chronic pain: Secondary | ICD-10-CM

## 2020-07-12 DIAGNOSIS — R7303 Prediabetes: Secondary | ICD-10-CM

## 2020-07-12 DIAGNOSIS — Q667 Congenital pes cavus, unspecified foot: Secondary | ICD-10-CM | POA: Diagnosis not present

## 2020-07-12 DIAGNOSIS — R700 Elevated erythrocyte sedimentation rate: Secondary | ICD-10-CM | POA: Diagnosis not present

## 2020-07-12 DIAGNOSIS — M533 Sacrococcygeal disorders, not elsewhere classified: Secondary | ICD-10-CM | POA: Diagnosis not present

## 2020-07-12 DIAGNOSIS — R21 Rash and other nonspecific skin eruption: Secondary | ICD-10-CM

## 2020-07-12 DIAGNOSIS — E611 Iron deficiency: Secondary | ICD-10-CM

## 2020-07-12 DIAGNOSIS — H5213 Myopia, bilateral: Secondary | ICD-10-CM

## 2020-07-12 DIAGNOSIS — Z8669 Personal history of other diseases of the nervous system and sense organs: Secondary | ICD-10-CM

## 2020-07-12 DIAGNOSIS — M545 Low back pain, unspecified: Secondary | ICD-10-CM

## 2020-07-12 DIAGNOSIS — K429 Umbilical hernia without obstruction or gangrene: Secondary | ICD-10-CM

## 2020-07-12 DIAGNOSIS — F418 Other specified anxiety disorders: Secondary | ICD-10-CM

## 2020-07-12 DIAGNOSIS — Z840 Family history of diseases of the skin and subcutaneous tissue: Secondary | ICD-10-CM

## 2020-07-12 NOTE — Patient Instructions (Signed)

## 2020-07-14 LAB — HLA-B27 ANTIGEN: HLA-B27 Antigen: NEGATIVE

## 2020-07-14 LAB — CYCLIC CITRUL PEPTIDE ANTIBODY, IGG: Cyclic Citrullin Peptide Ab: 16 UNITS

## 2020-07-14 LAB — RHEUMATOID FACTOR: Rheumatoid fact SerPl-aCnc: <14 IU/mL (ref <14)

## 2020-07-14 NOTE — Progress Notes (Signed)
All the labs are within normal limits.  Patient was supposed to get x-rays at the hospital.  I do not see the results yet.  We will discuss results at the follow-up visit.

## 2020-07-15 ENCOUNTER — Ambulatory Visit: Payer: Managed Care, Other (non HMO)

## 2020-07-16 ENCOUNTER — Other Ambulatory Visit: Payer: Self-pay

## 2020-07-16 ENCOUNTER — Ambulatory Visit
Admission: RE | Admit: 2020-07-16 | Discharge: 2020-07-16 | Disposition: A | Discharge status: Home / Self Care | Payer: Managed Care, Other (non HMO) | Attending: Rheumatology | Admitting: Rheumatology

## 2020-07-16 ENCOUNTER — Ambulatory Visit
Admission: RE | Admit: 2020-07-16 | Discharge: 2020-07-16 | Disposition: A | Discharge status: Home / Self Care | Payer: Managed Care, Other (non HMO) | Source: Ambulatory Visit | Attending: Rheumatology | Admitting: Rheumatology

## 2020-07-16 DIAGNOSIS — G8929 Other chronic pain: Secondary | ICD-10-CM

## 2020-07-16 DIAGNOSIS — M545 Low back pain, unspecified: Secondary | ICD-10-CM | POA: Insufficient documentation

## 2020-07-16 DIAGNOSIS — M533 Sacrococcygeal disorders, not elsewhere classified: Secondary | ICD-10-CM | POA: Insufficient documentation

## 2020-07-17 NOTE — Progress Notes (Signed)
SI joint x-rays ordered degenerative changes.  I will discuss results at the follow-up visit.

## 2020-07-17 NOTE — Progress Notes (Signed)
X-ray of the lumbar spine is unremarkable.

## 2020-07-22 NOTE — Progress Notes (Deleted)
Office Visit Note  Patient: Nancy Molina             Date of Birth: 1990-08-19           MRN: [id number]             PCP: Marval Regal, NP Referring: Marval Regal, NP Visit Date: 08/04/2020 Occupation: @GUAROCC @  Subjective:  No chief complaint on file.   History of Present Illness: Nancy Molina is a 30 y.o. female ***   Activities of Daily Living:  Patient reports morning stiffness for *** {minute/hour:19697}.   Patient {ACTIONS;DENIES/REPORTS:21021675::"Denies"} nocturnal pain.  Difficulty dressing/grooming: {ACTIONS;DENIES/REPORTS:21021675::"Denies"} Difficulty climbing stairs: {ACTIONS;DENIES/REPORTS:21021675::"Denies"} Difficulty getting out of chair: {ACTIONS;DENIES/REPORTS:21021675::"Denies"} Difficulty using hands for taps, buttons, cutlery, and/or writing: {ACTIONS;DENIES/REPORTS:21021675::"Denies"}  No Rheumatology ROS completed.   PMFS History:  Patient Active Problem List   Diagnosis Date Noted  . Iron deficiency 07/01/2020  . BMI 70 and over, adult ([place]) 03/25/2020  . Prediabetes 12/17/2019  . ESR raised 12/17/2019  . Umbilical pain 21/19/4174  . Umbilical hernia without obstruction and without gangrene 12/02/2019  . Leukocytosis 11/18/2019  . Anxiety with depression 10/22/2019  . Morbid obesity ([place]) 10/22/2019  . Annual physical exam 10/22/2019  . Myopia, bilateral 03/25/2019  . Myopia 02/08/2012    Past Medical History:  Diagnosis Date  . Anxiety   . Depression   . IBS (irritable bowel syndrome)   . Obesity   . Obsessive-compulsive disorder   . OCD (obsessive compulsive disorder)     Family History  Problem Relation Age of Onset  . Depression Mother   . Anxiety disorder Mother   . Alcohol abuse Mother   . Eating disorder Mother   . Obesity Father   . Heart disease Father   . Alcohol abuse Father   . Depression Father   . Polycystic ovary syndrome Sister   . Alcohol abuse Maternal Grandmother   . Rheum arthritis  Maternal Grandmother   . Alcohol abuse Paternal Grandfather   . Depression Paternal Grandfather   . Colon cancer Maternal Grandfather   . Rheum arthritis Maternal Aunt   . Healthy Daughter    Past Surgical History:  Procedure Laterality Date  . DILATION AND CURETTAGE OF UTERUS    . TONSILLECTOMY AND ADENOIDECTOMY     Social History   Social History Narrative   Born in [place], later moved to [place]. She lives with husband and 66 yo daughter Willaim Rayas.   Two older sisters. 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   Immunization History  Administered Date(s) Administered  . Influenza,inj,Quad PF,6+ Mos 03/25/2020  . Influenza-Unspecified 02/14/2019     Objective: Vital Signs: There were no vitals taken for this visit.   Physical Exam   Musculoskeletal Exam: ***  CDAI Exam: CDAI Score: -- Patient Global: --; Provider Global: -- Swollen: --; Tender: -- Joint Exam 08/04/2020   No joint exam has been documented for this visit   There is currently no information documented on the homunculus. Go to the Rheumatology activity and complete the homunculus joint exam.  Investigation: No additional findings.  Imaging: DG Lumbar Spine 2-3 Views  Result Date: 07/16/2020 CLINICAL DATA:  Low back pain EXAM: LUMBAR SPINE - 2-3 VIEW COMPARISON:  12/04/2019 FINDINGS: There is no evidence of lumbar spine fracture. Alignment is normal. Intervertebral disc spaces are maintained. IMPRESSION: Negative. Electronically Signed   By: Davina Poke D.O.   On: 07/16/2020 17:18  DG Si Joints  Result Date: 07/16/2020 CLINICAL DATA:  Low back pain EXAM: BILATERAL SACROILIAC JOINTS - 3+ VIEW COMPARISON:  12/04/2019 FINDINGS: The sacroiliac joint spaces are maintained. No ankylosis. Mild sclerotic changes of the bilateral SI joints are favored to reflect early degenerative changes rather than sacroiliitis. No erosions are seen. No other bone  abnormalities are seen. IMPRESSION: Mild sclerotic changes of the bilateral SI joints, favored to reflect early degenerative changes rather than sacroiliitis. Electronically Signed   By: Davina Poke D.O.   On: 07/16/2020 17:18    Recent Labs: Lab Results  Component Value Date   WBC 11.0 (H) 06/29/2020   HGB 12.9 06/29/2020   PLT 312 06/29/2020   NA 136 03/25/2020   K 3.9 03/25/2020   CL 100 03/25/2020   CO2 26 03/25/2020   GLUCOSE 92 03/25/2020   BUN 13 03/25/2020   CREATININE 0.67 03/25/2020   BILITOT 0.4 10/21/2019   ALKPHOS 94 10/21/2019   AST 12 10/21/2019   ALT 15 10/21/2019   PROT 7.2 10/21/2019   ALBUMIN 4.3 10/21/2019   CALCIUM 9.1 03/25/2020   GFRAA >60 06/29/2012   July 12, 2020 RF negative, anti-CCP negative, HLA-B27 negative  Speciality Comments: No specialty comments available.  Procedures:  No procedures performed Allergies: Clindamycin/lincomycin, Methocarbamol, and Clindamycin   Assessment / Plan:     Visit Diagnoses: No diagnosis found.  Orders: No orders of the defined types were placed in this encounter.  No orders of the defined types were placed in this encounter.   Face-to-face time spent with patient was *** minutes. Greater than 50% of time was spent in counseling and coordination of care.  Follow-Up Instructions: No follow-ups on file.   Bo Merino, MD  Note - This record has been created using Editor, commissioning.  Chart creation errors have been sought, but may not always  have been located. Such creation errors do not reflect on  the standard of medical care.

## 2020-07-26 ENCOUNTER — Encounter: Payer: Self-pay | Admitting: Rheumatology

## 2020-07-27 NOTE — Telephone Encounter (Signed)
Okay to schedule a virtual visit.

## 2020-07-29 NOTE — Progress Notes (Deleted)
Office Visit Note  Patient: Nancy Molina             Date of Birth: December 24, 1990           MRN: [id number]             PCP: Marval Regal, NP Referring: Marval Regal, NP Visit Date: 08/03/2020 Occupation: @GUAROCC @  Subjective:  No chief complaint on file.   History of Present Illness: Nancy Molina is a 30 y.o. female ***   Activities of Daily Living:  Patient reports morning stiffness for *** {minute/hour:19697}.   Patient {ACTIONS;DENIES/REPORTS:21021675::"Denies"} nocturnal pain.  Difficulty dressing/grooming: {ACTIONS;DENIES/REPORTS:21021675::"Denies"} Difficulty climbing stairs: {ACTIONS;DENIES/REPORTS:21021675::"Denies"} Difficulty getting out of chair: {ACTIONS;DENIES/REPORTS:21021675::"Denies"} Difficulty using hands for taps, buttons, cutlery, and/or writing: {ACTIONS;DENIES/REPORTS:21021675::"Denies"}  No Rheumatology ROS completed.   PMFS History:  Patient Active Problem List   Diagnosis Date Noted  . Iron deficiency 07/01/2020  . BMI 70 and over, adult ([facility]) 03/25/2020  . Prediabetes 12/17/2019  . ESR raised 12/17/2019  . Umbilical pain 34/74/2595  . Umbilical hernia without obstruction and without gangrene 12/02/2019  . Leukocytosis 11/18/2019  . Anxiety with depression 10/22/2019  . Morbid obesity ([facility]) 10/22/2019  . Annual physical exam 10/22/2019  . Myopia, bilateral 03/25/2019  . Myopia 02/08/2012    Past Medical History:  Diagnosis Date  . Anxiety   . Depression   . IBS (irritable bowel syndrome)   . Obesity   . Obsessive-compulsive disorder   . OCD (obsessive compulsive disorder)     Family History  Problem Relation Age of Onset  . Depression Mother   . Anxiety disorder Mother   . Alcohol abuse Mother   . Eating disorder Mother   . Obesity Father   . Heart disease Father   . Alcohol abuse Father   . Depression Father   . Polycystic ovary syndrome Sister   . Alcohol abuse Maternal Grandmother   . Rheum arthritis  Maternal Grandmother   . Alcohol abuse Paternal Grandfather   . Depression Paternal Grandfather   . Colon cancer Maternal Grandfather   . Rheum arthritis Maternal Aunt   . Healthy Daughter    Past Surgical History:  Procedure Laterality Date  . DILATION AND CURETTAGE OF UTERUS    . TONSILLECTOMY AND ADENOIDECTOMY     Social History   Social History Narrative   Born in [place], later moved to [place]. She lives with husband and 65 yo daughter Willaim Rayas.   Two older sisters. 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   Immunization History  Administered Date(s) Administered  . Influenza,inj,Quad PF,6+ Mos 03/25/2020  . Influenza-Unspecified 02/14/2019     Objective: Vital Signs: There were no vitals taken for this visit.   Physical Exam   Musculoskeletal Exam: ***  CDAI Exam: CDAI Score: -- Patient Global: --; Provider Global: -- Swollen: --; Tender: -- Joint Exam 08/03/2020   No joint exam has been documented for this visit   There is currently no information documented on the homunculus. Go to the Rheumatology activity and complete the homunculus joint exam.  Investigation: No additional findings.  Imaging: DG Lumbar Spine 2-3 Views  Result Date: 07/16/2020 CLINICAL DATA:  Low back pain EXAM: LUMBAR SPINE - 2-3 VIEW COMPARISON:  12/04/2019 FINDINGS: There is no evidence of lumbar spine fracture. Alignment is normal. Intervertebral disc spaces are maintained. IMPRESSION: Negative. Electronically Signed   By: Davina Poke D.O.   On: 07/16/2020 17:18  DG Si Joints  Result Date: 07/16/2020 CLINICAL DATA:  Low back pain EXAM: BILATERAL SACROILIAC JOINTS - 3+ VIEW COMPARISON:  12/04/2019 FINDINGS: The sacroiliac joint spaces are maintained. No ankylosis. Mild sclerotic changes of the bilateral SI joints are favored to reflect early degenerative changes rather than sacroiliitis. No erosions are seen. No other bone  abnormalities are seen. IMPRESSION: Mild sclerotic changes of the bilateral SI joints, favored to reflect early degenerative changes rather than sacroiliitis. Electronically Signed   By: Davina Poke D.O.   On: 07/16/2020 17:18    Recent Labs: Lab Results  Component Value Date   WBC 11.0 (H) 06/29/2020   HGB 12.9 06/29/2020   PLT 312 06/29/2020   NA 136 03/25/2020   K 3.9 03/25/2020   CL 100 03/25/2020   CO2 26 03/25/2020   GLUCOSE 92 03/25/2020   BUN 13 03/25/2020   CREATININE 0.67 03/25/2020   BILITOT 0.4 10/21/2019   ALKPHOS 94 10/21/2019   AST 12 10/21/2019   ALT 15 10/21/2019   PROT 7.2 10/21/2019   ALBUMIN 4.3 10/21/2019   CALCIUM 9.1 03/25/2020   GFRAA >60 06/29/2012   July 12, 2020 RF negative, anti-CCP negative, HLA-B27 negative  Speciality Comments: No specialty comments available.  Procedures:  No procedures performed Allergies: Clindamycin/lincomycin, Methocarbamol, and Clindamycin   Assessment / Plan:     Visit Diagnoses: No diagnosis found.  Orders: No orders of the defined types were placed in this encounter.  No orders of the defined types were placed in this encounter.   Face-to-face time spent with patient was *** minutes. Greater than 50% of time was spent in counseling and coordination of care.  Follow-Up Instructions: No follow-ups on file.   Bo Merino, MD  Note - This record has been created using Editor, commissioning.  Chart creation errors have been sought, but may not always  have been located. Such creation errors do not reflect on  the standard of medical care.

## 2020-08-03 ENCOUNTER — Telehealth: Payer: Managed Care, Other (non HMO) | Admitting: Rheumatology

## 2020-08-03 ENCOUNTER — Other Ambulatory Visit: Payer: Self-pay

## 2020-08-03 DIAGNOSIS — R21 Rash and other nonspecific skin eruption: Secondary | ICD-10-CM

## 2020-08-03 DIAGNOSIS — Z8669 Personal history of other diseases of the nervous system and sense organs: Secondary | ICD-10-CM

## 2020-08-03 DIAGNOSIS — M545 Low back pain, unspecified: Secondary | ICD-10-CM

## 2020-08-03 DIAGNOSIS — G8929 Other chronic pain: Secondary | ICD-10-CM

## 2020-08-03 DIAGNOSIS — F418 Other specified anxiety disorders: Secondary | ICD-10-CM

## 2020-08-03 DIAGNOSIS — R7303 Prediabetes: Secondary | ICD-10-CM

## 2020-08-03 DIAGNOSIS — Z8261 Family history of arthritis: Secondary | ICD-10-CM

## 2020-08-03 DIAGNOSIS — Q667 Congenital pes cavus, unspecified foot: Secondary | ICD-10-CM

## 2020-08-03 DIAGNOSIS — K429 Umbilical hernia without obstruction or gangrene: Secondary | ICD-10-CM

## 2020-08-03 DIAGNOSIS — H5213 Myopia, bilateral: Secondary | ICD-10-CM

## 2020-08-03 DIAGNOSIS — Z840 Family history of diseases of the skin and subcutaneous tissue: Secondary | ICD-10-CM

## 2020-08-03 DIAGNOSIS — E611 Iron deficiency: Secondary | ICD-10-CM

## 2020-08-04 ENCOUNTER — Ambulatory Visit: Payer: Managed Care, Other (non HMO) | Admitting: Rheumatology

## 2020-08-04 DIAGNOSIS — K429 Umbilical hernia without obstruction or gangrene: Secondary | ICD-10-CM

## 2020-08-04 DIAGNOSIS — Z8669 Personal history of other diseases of the nervous system and sense organs: Secondary | ICD-10-CM

## 2020-08-04 DIAGNOSIS — R7303 Prediabetes: Secondary | ICD-10-CM

## 2020-08-04 DIAGNOSIS — H5213 Myopia, bilateral: Secondary | ICD-10-CM

## 2020-08-04 DIAGNOSIS — Q667 Congenital pes cavus, unspecified foot: Secondary | ICD-10-CM

## 2020-08-04 DIAGNOSIS — R700 Elevated erythrocyte sedimentation rate: Secondary | ICD-10-CM

## 2020-08-04 DIAGNOSIS — Z8261 Family history of arthritis: Secondary | ICD-10-CM

## 2020-08-04 DIAGNOSIS — R21 Rash and other nonspecific skin eruption: Secondary | ICD-10-CM

## 2020-08-04 DIAGNOSIS — G8929 Other chronic pain: Secondary | ICD-10-CM

## 2020-08-04 DIAGNOSIS — M545 Low back pain, unspecified: Secondary | ICD-10-CM

## 2020-08-04 DIAGNOSIS — E611 Iron deficiency: Secondary | ICD-10-CM

## 2020-08-04 DIAGNOSIS — F418 Other specified anxiety disorders: Secondary | ICD-10-CM

## 2020-08-04 DIAGNOSIS — Z840 Family history of diseases of the skin and subcutaneous tissue: Secondary | ICD-10-CM

## 2020-08-07 NOTE — Progress Notes (Signed)
Virtual Visit via Video Note  I connected with Nancy Molina on 08/11/20 at  4:00 PM EDT by a video enabled telemedicine application and verified that I am speaking with the correct person using two identifiers.  Location: Patient: Home  Provider: Clinic   This service was conducted via virtual visit.  Both audio and visual tools were used.  The patient was located at home. I was located in my office.  Consent was obtained prior to the virtual visit and is aware of possible charges through their insurance for this visit.  The patient is an established patient.  Dr. Estanislado Pandy, MD conducted the virtual visit and Hazel Sams, PA-C acted as scribe during the service.  Office staff helped with scheduling follow up visits after the service was conducted.   I discussed the limitations of evaluation and management by telemedicine and the availability of in person appointments. The patient expressed understanding and agreed to proceed.  CC: Discuss lab work  History of Present Illness: Patient is a 29 year old female with a past medical history of elevated ESR and chronic SI joint pain. Lab results from 07/12/20 were reviewed with the patient today and all questions were addressed.  She continues to have chronic lower back pain especially in both SI joints.  She continues to have morning stiffness for about 15 minutes daily. She denies any achilles tendonitis, plantar fasciitis, or eye inflammation in the past.  She has not had any joint swelling.  She continues to have dry skin but has not seen a dermatologist yet.     Review of Systems  Constitutional: Positive for malaise/fatigue. Negative for fever.  Eyes: Negative for photophobia, pain, discharge and redness.  Respiratory: Negative for cough, shortness of breath and wheezing.   Cardiovascular: Negative for chest pain and palpitations.  Gastrointestinal: Negative for blood in stool, constipation and diarrhea.  Genitourinary: Negative for  dysuria.  Musculoskeletal: Positive for joint pain and myalgias. Negative for back pain and neck pain.       +Morning stiffness  +Muscle tenderness   Skin: Negative for rash.  Neurological: Negative for dizziness and headaches.  Psychiatric/Behavioral: Negative for depression. The patient is not nervous/anxious and does not have insomnia.       Observations/Objective: Physical Exam HENT:     Head: Normocephalic and atraumatic.  Eyes:     Conjunctiva/sclera: Conjunctivae normal.  Pulmonary:     Effort: Pulmonary effort is normal.  Neurological:     Mental Status: She is alert and oriented to person, place, and time.  Psychiatric:        Mood and Affect: Mood and affect normal.        Cognition and Memory: Memory normal.        Judgment: Judgment normal.    Patient reports morning stiffness for 15 minutes.   Patient denies nocturnal pain.  Difficulty dressing/grooming: Reports Difficulty climbing stairs: Denies Difficulty getting out of chair: Denies Difficulty using hands for taps, buttons, cutlery, and/or writing: Denies     Assessment and Plan: Visit Diagnoses: Elevated sed rate - 11/18/19: ESR 33, CRP 3.1, 11/26/19: ANA-, 12/17/19: ESR 51.  No signs of inflammatory otitis on her initial office visit.  She has not noticed any joint swelling.  Discussed additional lab work obtained on 07/12/2020: RF-, anti-CCP-, and HLA-B27 negative.  All questions were addressed. Discussed that the sed rate is a nonspecific test and could be secondary to chronic anemia.  She has been following up with her hematologist  closely.  She was advised to have her sed rate rechecked with upcoming lab work.  Chronic midline low back pain without sciatica -HLA-B27 negative. She has had chronic discomfort in her entire spine since she was a small child.   She continues to have persistent lower back pain and morning stiffness lasting about 15 minutes daily.  She also has intermittent discomfort in both SI  joints.  X-rays of the lumbar spine and SI joints were obtained on 07/16/2020.  There was no evidence of lumbar spine fracture and the intervertebral disc spaces were maintained.  She had mild sclerotic SI joint changes but no evidence of sacroiliitis.  X-ray results were reviewed with the patient today during the telemedicine visit.  She has not had any Achilles tendinitis, plantar fasciitis, or iritis in the past.  She has no evidence of psoriasis at this time. She was advised to notify us if she develops any new or worsening symptoms.  She will follow-up as needed.  Chronic SI joint pain -HLA-B27 negative. X-rays of the SI joints were obtained on 07/16/20: Findings were consistent with mild sclerotic changes of bilateral SI joints, favored to reflect early degenerative changes rather than sacroiliitis.  These x-ray findings were discussed with the patient today.  She was advised to notify us if her discomfort persists or worsens.  Pes cavus-Bilateral. Use of shoes with arch support was discussed. She was encouraged to be fitted for orthotics.  She is not experiencing any plantar fasciitis or achilles tendonitis.   Rash-She has ongoing dry skin especially on her hands and plantar aspect of both feet.  She will be establishing care with a new PCP at which time she will ask for a referral to dermatology.  At her initial office visit there was no evidence of psoriasis.  Other medical problems are listed as follows:  Prediabetes  Anxiety with depression  Umbilical hernia without obstruction and without gangrene  Iron deficiency - Followed by a hematologist.  She is on iron supplement.  History of migraine  Myopia, bilateral  Family history of lupus erythematosus  Family history of rheumatoid arthritis  Follow Up Instructions: She will follow up as needed    I discussed the assessment and treatment plan with the patient. The patient was provided an opportunity to ask questions and  all were answered. The patient agreed with the plan and demonstrated an understanding of the instructions.   The patient was advised to call back or seek an in-person evaluation if the symptoms worsen or if the condition fails to improve as anticipated.  Face-to-face time spent patient was 69mnutes.  Scribed by-Hazel Sams PA-C  I reviewed the above note and attest to the accuracy of the document.  SBo Merino MD

## 2020-08-11 ENCOUNTER — Encounter: Payer: Self-pay | Admitting: Rheumatology

## 2020-08-11 ENCOUNTER — Telehealth (INDEPENDENT_AMBULATORY_CARE_PROVIDER_SITE_OTHER): Payer: Managed Care, Other (non HMO) | Admitting: Rheumatology

## 2020-08-11 ENCOUNTER — Other Ambulatory Visit: Payer: Self-pay

## 2020-08-11 ENCOUNTER — Encounter: Payer: Self-pay | Admitting: Adult Health

## 2020-08-11 VITALS — Ht 64.0 in

## 2020-08-11 DIAGNOSIS — Q667 Congenital pes cavus, unspecified foot: Secondary | ICD-10-CM

## 2020-08-11 DIAGNOSIS — M533 Sacrococcygeal disorders, not elsewhere classified: Secondary | ICD-10-CM

## 2020-08-11 DIAGNOSIS — Z8261 Family history of arthritis: Secondary | ICD-10-CM

## 2020-08-11 DIAGNOSIS — M545 Low back pain, unspecified: Secondary | ICD-10-CM | POA: Diagnosis not present

## 2020-08-11 DIAGNOSIS — G8929 Other chronic pain: Secondary | ICD-10-CM

## 2020-08-11 DIAGNOSIS — R700 Elevated erythrocyte sedimentation rate: Secondary | ICD-10-CM | POA: Diagnosis not present

## 2020-08-11 DIAGNOSIS — R21 Rash and other nonspecific skin eruption: Secondary | ICD-10-CM

## 2020-08-11 DIAGNOSIS — Z840 Family history of diseases of the skin and subcutaneous tissue: Secondary | ICD-10-CM

## 2020-08-19 ENCOUNTER — Ambulatory Visit: Payer: Self-pay | Admitting: Adult Health

## 2020-08-24 ENCOUNTER — Encounter: Payer: Self-pay | Admitting: Oncology

## 2020-10-06 ENCOUNTER — Telehealth (HOSPITAL_COMMUNITY): Payer: Self-pay

## 2020-10-06 MED ORDER — SERTRALINE HCL 100 MG PO TABS: 300.0000 mg | ORAL_TABLET | Freq: Every day | ORAL | 0 refills | Status: DC

## 2020-10-06 NOTE — Telephone Encounter (Signed)
Medication management - Generic voice message left for patient as her voicemail did not identify herself, to please call our office back regarding appointment for 10/13/20.  Zadie Cleverly, pharmacist at [facility] in [facility] to verify patient still has one 90 day refill of Trazodone remaining from previous Dr. Hinton Dyer order but has no refills of Zoloft, last picked up on 08/25/20 for 90 days.  Patient will need one additional order for Zoloft and for resource letter of potential alternative providers since there is no current provider in our [facility] practices that can take over her treatment.  Encouraged patient to call back and letter to be sent.

## 2020-10-06 NOTE — Telephone Encounter (Signed)
A thirty days bridge supply of Zoloft 300 mg given by Covering MD. Patient must established care with new provider.

## 2020-10-06 NOTE — Telephone Encounter (Signed)
Medication management - Patient called back this date to discuss the need to cancel her appointment with our [facility] Outpatient practices 10/13/20.  Stated she understood the need to now locate a new provider for her psychiatric services and that a letter would be mailed to her home with possible alternative provider options.  Discussed with patient from discussion with her pharmacy she still has one refill remaining of Trazodone and agreed to request covering provider provide one additional refill of Zoloft to assist during transition while she is establishing care with another provider.  Patient to call back if any issues with transition.

## 2020-10-13 ENCOUNTER — Telehealth (HOSPITAL_COMMUNITY): Payer: 59 | Admitting: Psychiatry

## 2020-11-02 ENCOUNTER — Encounter: Payer: Managed Care, Other (non HMO) | Admitting: Adult Health

## 2020-12-01 ENCOUNTER — Other Ambulatory Visit: Payer: Self-pay

## 2020-12-01 ENCOUNTER — Encounter: Payer: Self-pay | Admitting: Oncology

## 2020-12-01 DIAGNOSIS — D72829 Elevated white blood cell count, unspecified: Secondary | ICD-10-CM

## 2020-12-01 NOTE — Telephone Encounter (Signed)
Pt scheduled for labs on 8/18. Please advise on request.

## 2020-12-08 ENCOUNTER — Encounter: Payer: Self-pay | Admitting: Family

## 2020-12-08 ENCOUNTER — Telehealth: Payer: Self-pay | Admitting: Family

## 2020-12-08 ENCOUNTER — Telehealth: Payer: Managed Care, Other (non HMO) | Admitting: Family

## 2020-12-08 VITALS — Ht 64.0 in

## 2020-12-08 DIAGNOSIS — L988 Other specified disorders of the skin and subcutaneous tissue: Secondary | ICD-10-CM | POA: Insufficient documentation

## 2020-12-08 DIAGNOSIS — R1310 Dysphagia, unspecified: Secondary | ICD-10-CM | POA: Insufficient documentation

## 2020-12-08 DIAGNOSIS — H029 Unspecified disorder of eyelid: Secondary | ICD-10-CM | POA: Insufficient documentation

## 2020-12-08 DIAGNOSIS — R220 Localized swelling, mass and lump, head: Secondary | ICD-10-CM | POA: Diagnosis not present

## 2020-12-08 DIAGNOSIS — R229 Localized swelling, mass and lump, unspecified: Secondary | ICD-10-CM | POA: Insufficient documentation

## 2020-12-08 DIAGNOSIS — H00019 Hordeolum externum unspecified eye, unspecified eyelid: Secondary | ICD-10-CM | POA: Insufficient documentation

## 2020-12-08 DIAGNOSIS — H52229 Regular astigmatism, unspecified eye: Secondary | ICD-10-CM | POA: Insufficient documentation

## 2020-12-08 MED ORDER — VALACYCLOVIR HCL 1 G PO TABS: ORAL_TABLET | ORAL | 2 refills | Status: DC

## 2020-12-08 MED ORDER — ERYTHROMYCIN 5 MG/GM OP OINT: TOPICAL_OINTMENT | OPHTHALMIC | 0 refills | Status: DC

## 2020-12-08 NOTE — Telephone Encounter (Signed)
Call pt Advise that I discussed with Dr Darrick Huntsman and we agreed that in the absence of extreme swelling near the left ear, fever, less like mumps ( parotidis). As mumps is viral and most often self limiting ,she may take tylenol ,apply heat/ cold compresses if needed. We will not perform oral swab for pumps.   Please add herMy sch for 12/10/20 at 12:15 in person

## 2020-12-08 NOTE — Assessment & Plan Note (Signed)
No ocular pain, vision changes. She is non contact lense wearer. Differential diagnosis conjunctivitis complicated by herpetic lesions. As lesions are proximal to eye and difficult to assess over virtual visit, we will start valtrex. Lesions are not open, draining therefore I have not ordered herpetic culture. Start erythromycin opthalmologic. I will see patient again in 2 days for follow up.

## 2020-12-08 NOTE — Assessment & Plan Note (Signed)
Unable to appreciate over virtual visit. Patient describes swelling, tenderness proximal to TMJ and left ear. No edema appreciated toward jaw, throat on video. She is afebrile. I discussed case with Dr Darrick Huntsman and we agreed that in the absence of extreme swelling near the left ear, fever, less likely parotitis. Counseled patient that parotitis is often viral, self limiting ,she may take tylenol ,apply heat/ cold compresses if needed. Follow in person in 2 days to monitor clinically.

## 2020-12-08 NOTE — Telephone Encounter (Signed)
I have scheduled patient at 12:30 for 15 minute appointment. She stated that she was not sure if she could get off work on such short notice, but would call manager to see & call us back to let us now for sure that she could keep appointment,

## 2020-12-08 NOTE — Progress Notes (Signed)
Virtual Visit via Video Note  I connected with@  on 12/08/20 at 12:00 PM EDT by a video enabled telemedicine application and verified that I am speaking with the correct person using two identifiers.  Location patient: home Location provider:work  Persons participating in the virtual visit: patient, provider  I discussed the limitations of evaluation and management by telemedicine and the availability of in person appointments. The patient expressed understanding and agreed to proceed.   HPI:  Acute visit Complains of left eye being red and swollen , x 1 day.  Left eyelashes stuck together White tender bumps in the corner of the eye proximal to nose on lower lid. Appearance and sensation remainds her of cold sore. Itchy.  Lesions are not draining.  Scant sinus pressure, gritty sensation.No vision changes, eye ball pain, foreign sensation, photophobia.   Endorses HA frontal today, which is mild. HA doesn't feel like typical migraine and describes as 'annoying'.  H/o  migraine.  Left side jaw pain without rash at TMJ started this morning. Aggravated with movement. No left sided ear pain, or sounds muffled.   She has h/o HSV -1 .   No contacts. She wears glasses.   H/o chicken pox   ROS: See pertinent positives and negatives per HPI.    EXAM:  VITALS per patient if applicable: Ht 5\' 4"  (1.626 m)   BMI 71.75 kg/m  BP Readings from Last 3 Encounters:  07/12/20 135/86  03/25/20 124/86  12/17/19 122/78   Wt Readings from Last 3 Encounters:  07/12/20 (!) 418 lb (189.6 kg)  03/25/20 (!) 411 lb (186.4 kg)  01/29/20 (!) 402 lb (182.3 kg)    GENERAL: alert, oriented, appears well and in no acute distress  HEENT: atraumatic, conjunttiva clear, no obvious abnormalities on inspection of external nose and ears  Eyes: unable to see eye lesions. No edema of eye lid. Conjunctiva of left eye are diffusely erythematous.   NECK: normal movements of the head and neck  LUNGS: on  inspection no signs of respiratory distress, breathing rate appears normal, no obvious gross SOB, gasping or wheezing  CV: no obvious cyanosis  MS: moves all visible extremities without noticeable abnormality  PSYCH/NEURO: pleasant and cooperative, no obvious depression or anxiety, speech and thought processing grossly intact  ASSESSMENT AND PLAN:  Discussed the following assessment and plan:  Problem List Items Addressed This Visit       Other   Facial swelling    Unable to appreciate over virtual visit. Patient describes swelling, tenderness proximal to TMJ and left ear. No edema appreciated toward jaw, throat on video. She is afebrile. I discussed case with Dr 01/31/20 and we agreed that in the absence of extreme swelling near the left ear, fever, less likely parotitis. Counseled patient that parotitis is often viral, self limiting ,she may take tylenol ,apply heat/ cold compresses if needed. Follow in person in 2 days to monitor clinically.        Lesion of left lower eyelid - Primary    No ocular pain, vision changes. She is non contact lense wearer. Differential diagnosis conjunctivitis complicated by herpetic lesions. As lesions are proximal to eye and difficult to assess over virtual visit, we will start valtrex. Lesions are not open, draining therefore I have not ordered herpetic culture. Start erythromycin opthalmologic. I will see patient again in 2 days for follow up.        Relevant Medications   valACYclovir (VALTREX) 1000 MG tablet   erythromycin ophthalmic  ointment    -we discussed possible serious and likely etiologies, options for evaluation and workup, limitations of telemedicine visit vs in person visit, treatment, treatment risks and precautions. Pt prefers to treat via telemedicine empirically rather then risking or undertaking an in person visit at this moment.  .   I discussed the assessment and treatment plan with the patient. The patient was provided an  opportunity to ask questions and all were answered. The patient agreed with the plan and demonstrated an understanding of the instructions.   The patient was advised to call back or seek an in-person evaluation if the symptoms worsen or if the condition fails to improve as anticipated.   Rennie Plowman, FNP

## 2020-12-10 ENCOUNTER — Encounter: Payer: Self-pay | Admitting: Family

## 2020-12-10 ENCOUNTER — Ambulatory Visit: Payer: Managed Care, Other (non HMO) | Admitting: Family

## 2020-12-10 ENCOUNTER — Other Ambulatory Visit: Payer: Self-pay

## 2020-12-10 VITALS — BP 100/64 | HR 113 | Temp 98.8°F | Ht 64.0 in | Wt >= 6400 oz

## 2020-12-10 DIAGNOSIS — H6690 Otitis media, unspecified, unspecified ear: Secondary | ICD-10-CM | POA: Insufficient documentation

## 2020-12-10 DIAGNOSIS — H65192 Other acute nonsuppurative otitis media, left ear: Secondary | ICD-10-CM

## 2020-12-10 DIAGNOSIS — H029 Unspecified disorder of eyelid: Secondary | ICD-10-CM | POA: Diagnosis not present

## 2020-12-10 MED ORDER — AMOXICILLIN-POT CLAVULANATE 875-125 MG PO TABS: 1.0000 | ORAL_TABLET | Freq: Two times a day (BID) | ORAL | 0 refills | Status: AC

## 2020-12-10 NOTE — Progress Notes (Signed)
Subjective:    Patient ID: Nancy Molina, female    DOB: Jun 02, 1990, 30 y.o.   MRN: [id number]  CC: Nancy Molina is a 30 y.o. female who presents today for an acute visit.    HPI: Follow-up left eye lid swelling, redness are unchanged. She has been compliant with erythromycin ointment, valtrex.   Endorses right eye discomfort though not sure if related to eye swelling.  Endorses HA. She endorses some slight photophobia.   No vision changes, F.  Left eye continues to be matted shut in the morning.   Bumps resolved close to inner canthus.  Left side of jaw continues to feel sore and more swollen than right side. No ear pain, discharge from  left ear     She follows with optometrist, [facility].   HISTORY:  Past Medical History:  Diagnosis Date   Anxiety    Depression    IBS (irritable bowel syndrome)    Obesity    Obsessive-compulsive disorder    OCD (obsessive compulsive disorder)    Past Surgical History:  Procedure Laterality Date   DILATION AND CURETTAGE OF UTERUS     TONSILLECTOMY AND ADENOIDECTOMY     Family History  Problem Relation Age of Onset   Depression Mother    Anxiety disorder Mother    Alcohol abuse Mother    Eating disorder Mother    Obesity Father    Heart disease Father    Alcohol abuse Father    Depression Father    Polycystic ovary syndrome Sister    Alcohol abuse Maternal Grandmother    Rheum arthritis Maternal Grandmother    Alcohol abuse Paternal Grandfather    Depression Paternal Grandfather    Colon cancer Maternal Grandfather    Rheum arthritis Maternal Aunt    Healthy Daughter     Allergies: Clindamycin/lincomycin, Methocarbamol, and Clindamycin Current Outpatient Medications on File Prior to Visit  Medication Sig Dispense Refill   Acetaminophen (TYLENOL PO) Take by mouth as needed.     desogestrel-ethinyl estradiol (APRI) 0.15-30 MG-MCG tablet TAKE 1 TABLET BY MOUTH EVERY DAY     erythromycin ophthalmic ointment  Use one half inch four times daily to affected eye (s) x 7 days. 3.5 g 0   FLUoxetine (PROZAC) 10 MG tablet Take 0.5 tablets by mouth See admin instructions.     Iron-Vitamin C 65-125 MG TABS Take 1 tablet by mouth daily. 90 tablet 1   Loratadine 10 MG CAPS Take by mouth. Reported on 09/16/2015     Multiple Vitamin (MULTIVITAMIN ADULT PO) Take by mouth.     sertraline (ZOLOFT) 100 MG tablet Take 3 tablets (300 mg total) by mouth daily. (Patient taking differently: Take 200 mg by mouth daily.) 90 tablet 0   traZODone (DESYREL) 50 MG tablet TAKE 1 TABLET (50 MG TOTAL) BY MOUTH AT BEDTIME AS NEEDED FOR SLEEP. 90 tablet 1   valACYclovir (VALTREX) 1000 MG tablet Take two tablets ( total 2000 mg) by mouth q12h x 1 day; Start: ASAP after symptom onset 6 tablet 2   No current facility-administered medications on file prior to visit.    Social History   Tobacco Use   Smoking status: Never   Smokeless tobacco: Never  Vaping Use   Vaping Use: Never used  Substance Use Topics   Alcohol use: No    Alcohol/week: 0.0 standard drinks   Drug use: No    Review of Systems  Constitutional:  Negative for chills and fever.  HENT:  Positive for facial swelling. Negative for congestion and ear pain.   Eyes:  Positive for photophobia, pain, discharge and redness. Negative for itching and visual disturbance.  Respiratory:  Negative for cough.   Cardiovascular:  Negative for chest pain and palpitations.  Gastrointestinal:  Negative for nausea and vomiting.  Neurological:  Positive for headaches.     Objective:    BP 100/64 (BP Location: Left Arm, Patient Position: Sitting, Cuff Size: Large)   Pulse (!) 113   Temp 98.8 F (37.1 C) (Oral)   Ht 5\' 4"  (1.626 m)   Wt (!) 436 lb 12.8 oz (198.1 kg)   SpO2 97%   BMI 74.98 kg/m    Physical Exam Vitals reviewed.  Constitutional:      Appearance: She is well-developed.  HENT:     Head: Normocephalic and atraumatic.     Jaw: Tenderness present. No  swelling or pain on movement.      Comments: Tenderness on exam over TMJ    Right Ear: Hearing, tympanic membrane, ear canal and external ear normal. No decreased hearing noted. No drainage, swelling or tenderness. No middle ear effusion. No foreign body. Tympanic membrane is not erythematous or bulging.     Left Ear: Hearing, ear canal and external ear normal. No decreased hearing noted. No drainage, swelling or tenderness.  No middle ear effusion. No foreign body. Tympanic membrane is injected. Tympanic membrane is not erythematous or bulging.     Nose: Nose normal. No rhinorrhea.     Right Sinus: No maxillary sinus tenderness or frontal sinus tenderness.     Left Sinus: No maxillary sinus tenderness or frontal sinus tenderness.     Mouth/Throat:     Pharynx: Uvula midline. No oropharyngeal exudate or posterior oropharyngeal erythema.     Tonsils: No tonsillar abscesses.  Eyes:     General: Lids are everted, no foreign bodies appreciated. No scleral icterus.       Right eye: No foreign body, discharge or hordeolum.        Left eye: No foreign body, discharge or hordeolum.     Conjunctiva/sclera: Conjunctivae normal.     Right eye: Right conjunctiva is not injected. No hemorrhage.    Left eye: Left conjunctiva is not injected. No hemorrhage.    Pupils: Pupils are equal, round, and reactive to light.     Comments: No external eye lesions. Surrounding skin intact.   Left eye:   Diffuse injection of the conjunctiva. No white spots, opacity, or foreign body appreciated. No collection of blood or pus in the anterior chamber. No ciliary flush surrounding iris.   No photophobia or eye pain appreciated during exam. She was able to look directly at light.   Cardiovascular:     Rate and Rhythm: Regular rhythm.     Pulses: Normal pulses.     Heart sounds: Normal heart sounds.  Pulmonary:     Effort: Pulmonary effort is normal.     Breath sounds: Normal breath sounds. No wheezing, rhonchi or  rales.  Lymphadenopathy:     Head:     Right side of head: No submental, submandibular, tonsillar, preauricular, posterior auricular or occipital adenopathy.     Left side of head: No submental, submandibular, tonsillar, preauricular, posterior auricular or occipital adenopathy.     Cervical: No cervical adenopathy.  Skin:    General: Skin is warm and dry.  Neurological:     Mental Status: She is alert.  Psychiatric:  Speech: Speech normal.        Behavior: Behavior normal.        Thought Content: Thought content normal.       Assessment & Plan:   Problem List Items Addressed This Visit       Nervous and Auditory   Otitis media - Primary    Slight injection of left tympanic membrane.  Concern for early otitis media.  In setting of localized tenderness over TMJ, advised to start augmentin. Advised probiotics.        Relevant Medications   amoxicillin-clavulanate (AUGMENTIN) 875-125 MG tablet     Other   Lesion of left lower eyelid    Eyelid lesions have resolved.  No eyelid swelling.  My concern is diffuse  injection of conjunctiva, subtle presentation of eye pain, photophobia and failure to respond to topical erythromycin ophthalmic.  No vision changes. Discussed differentials with patient and Dr Darrick Huntsman including iritis, keratitis.  We have called [facility] to advise of the urgent appointment with ophthalmologist and awaiting callback.  Also placedurgent referral.  Advised patient that over the weekend she is to remain vigilant as it relates to pain, vision changes, photophobia any worsening of the above she needs to present to the emergency room.  Patient verbalized understanding of all.  Case reviewed with supervising, Dr Duncan Dull, and she and I jointly agreed on management plan.         Relevant Orders   Ambulatory referral to Ophthalmology      I am having Nancy Molina start on amoxicillin-clavulanate. I am also having her maintain her Loratadine,  desogestrel-ethinyl estradiol, Iron-Vitamin C, Multiple Vitamin (MULTIVITAMIN ADULT PO), traZODone, Acetaminophen (TYLENOL PO), sertraline, FLUoxetine, valACYclovir, and erythromycin.   Meds ordered this encounter  Medications   amoxicillin-clavulanate (AUGMENTIN) 875-125 MG tablet    Sig: Take 1 tablet by mouth 2 (two) times daily for 7 days.    Dispense:  14 tablet    Refill:  0    Order Specific Question:   Supervising Provider    Answer:   Sherlene Shams [[id number]]    Return precautions given.   Risks, benefits, and alternatives of the medications and treatment plan prescribed today were discussed, and patient expressed understanding.   Education regarding symptom management and diagnosis given to patient on AVS.  Continue to follow with Theadore Nan, NP for routine health maintenance.   Nancy Molina and I agreed with plan.   Rennie Plowman, FNP

## 2020-12-10 NOTE — Assessment & Plan Note (Addendum)
Slight injection of left tympanic membrane.  Concern for early otitis media.  In setting of localized tenderness over TMJ, advised to start augmentin. Advised probiotics.

## 2020-12-10 NOTE — Patient Instructions (Signed)
Over the weekend, please be extremely vigilant if you have any vision changes, vision loss, worsening eye pain or sensitivity to light, please go the emergency room.  Otherwise we have placed urgent referral to [facility] and they will be calling you as well as our office to schedule you soon as possible.  Please call me on Monday morning if you have not heard from them.  You may also start over-the-counter Advil for overall discomfort and inflammation. Augmentin prescribed as well.  Ensure to take probiotics while on antibiotics and also for 2 weeks after completion. This can either be by eating yogurt daily or taking a probiotic supplement over the counter such as Culturelle.It is important to re-colonize the gut with good bacteria and also to prevent any diarrheal infections associated with antibiotic use.

## 2020-12-10 NOTE — Assessment & Plan Note (Addendum)
Eyelid lesions have resolved.  No eyelid swelling.  My concern is diffuse  injection of conjunctiva, subtle presentation of eye pain, photophobia and failure to respond to topical erythromycin ophthalmic.  No vision changes. Discussed differentials with patient and Dr Darrick Huntsman including iritis, keratitis.  We have called [facility] to advise of the urgent appointment with ophthalmologist and awaiting callback.  Also placedurgent referral.  Advised patient that over the weekend she is to remain vigilant as it relates to pain, vision changes, photophobia any worsening of the above she needs to present to the emergency room.  Patient verbalized understanding of all.  Case reviewed with supervising, Dr Duncan Dull, and she and I jointly agreed on management plan.

## 2020-12-22 ENCOUNTER — Ambulatory Visit: Payer: Managed Care, Other (non HMO) | Admitting: Adult Health

## 2020-12-29 ENCOUNTER — Other Ambulatory Visit: Payer: Self-pay

## 2020-12-29 ENCOUNTER — Inpatient Hospital Stay: Payer: Managed Care, Other (non HMO) | Attending: Oncology

## 2020-12-29 DIAGNOSIS — Z79899 Other long term (current) drug therapy: Secondary | ICD-10-CM | POA: Diagnosis not present

## 2020-12-29 DIAGNOSIS — D72829 Elevated white blood cell count, unspecified: Secondary | ICD-10-CM | POA: Insufficient documentation

## 2020-12-29 DIAGNOSIS — K589 Irritable bowel syndrome without diarrhea: Secondary | ICD-10-CM | POA: Insufficient documentation

## 2020-12-29 DIAGNOSIS — E611 Iron deficiency: Secondary | ICD-10-CM

## 2020-12-29 DIAGNOSIS — R918 Other nonspecific abnormal finding of lung field: Secondary | ICD-10-CM | POA: Diagnosis not present

## 2020-12-29 DIAGNOSIS — R718 Other abnormality of red blood cells: Secondary | ICD-10-CM

## 2020-12-29 DIAGNOSIS — E669 Obesity, unspecified: Secondary | ICD-10-CM | POA: Insufficient documentation

## 2020-12-29 LAB — CBC WITH DIFFERENTIAL/PLATELET
Abs Immature Granulocytes: 0.05 10*3/uL (ref 0.00–0.07)
Basophils Absolute: 0 10*3/uL (ref 0.0–0.1)
Basophils Relative: 0 %
Eosinophils Absolute: 0.3 10*3/uL (ref 0.0–0.5)
Eosinophils Relative: 2 %
HCT: 41.9 % (ref 36.0–46.0)
Hemoglobin: 13.1 g/dL (ref 12.0–15.0)
Immature Granulocytes: 0 %
Lymphocytes Relative: 18 %
Lymphs Abs: 2.3 10*3/uL (ref 0.7–4.0)
MCH: 24.6 pg — ABNORMAL LOW (ref 26.0–34.0)
MCHC: 31.3 g/dL (ref 30.0–36.0)
MCV: 78.8 fL — ABNORMAL LOW (ref 80.0–100.0)
Monocytes Absolute: 0.8 10*3/uL (ref 0.1–1.0)
Monocytes Relative: 6 %
Neutro Abs: 9.1 10*3/uL — ABNORMAL HIGH (ref 1.7–7.7)
Neutrophils Relative %: 74 %
Platelets: 325 10*3/uL (ref 150–400)
RBC: 5.32 MIL/uL — ABNORMAL HIGH (ref 3.87–5.11)
RDW: 15 % (ref 11.5–15.5)
WBC: 12.5 10*3/uL — ABNORMAL HIGH (ref 4.0–10.5)
nRBC: 0 % (ref 0.0–0.2)

## 2020-12-29 LAB — IRON AND TIBC
Iron: 40 ug/dL (ref 28–170)
Saturation Ratios: 11 % (ref 10.4–31.8)
TIBC: 360 ug/dL (ref 250–450)
UIBC: 320 ug/dL

## 2020-12-29 LAB — FERRITIN: Ferritin: 60 ng/mL (ref 11–307)

## 2020-12-29 LAB — T4, FREE: Free T4: 0.85 ng/dL (ref 0.61–1.12)

## 2020-12-29 LAB — TSH: TSH: 3.687 u[IU]/mL (ref 0.350–4.500)

## 2020-12-30 ENCOUNTER — Encounter: Payer: Self-pay | Admitting: Adult Health

## 2020-12-31 ENCOUNTER — Inpatient Hospital Stay (HOSPITAL_BASED_OUTPATIENT_CLINIC_OR_DEPARTMENT_OTHER): Payer: Managed Care, Other (non HMO) | Admitting: Oncology

## 2020-12-31 DIAGNOSIS — E611 Iron deficiency: Secondary | ICD-10-CM | POA: Diagnosis not present

## 2020-12-31 DIAGNOSIS — D72829 Elevated white blood cell count, unspecified: Secondary | ICD-10-CM

## 2020-12-31 NOTE — Progress Notes (Signed)
HEMATOLOGY-ONCOLOGY TeleHEALTH VISIT PROGRESS NOTE  I connected with Renika B Santana on 12/31/20  at  1:45 PM EDT by video enabled telemedicine visit and verified that I am speaking with the correct person using two identifiers. I discussed the limitations, risks, security and privacy concerns of performing an evaluation and management service by telemedicine and the availability of in-person appointments. The patient expressed understanding and agreed to proceed.   Other persons participating in the visit and their role in the encounter: NP, Patient   Patient's location: Home  Provider's location: office  Chief Complaint: Follow-up leukocytosis, microcytosis/ iron deficiency  Past medical history- Patient has past medical history significant for dysphagia, anxiety and depression, morbid obesity, prediabetes, and diagnosis of leukocytosis and iron deficiency.  She is currently taking oral iron supplements twice daily with good toleration.  INTERVAL HISTORY Ms. Brzoska is a 30 year old female who presents today for follow-up and lab work.  Reports doing well since her last visit.  Denies any interval infections or new concerns.  Review of Systems  All other systems reviewed and are negative.   Past Medical History:  Diagnosis Date   Anxiety    Depression    IBS (irritable bowel syndrome)    Obesity    Obsessive-compulsive disorder    OCD (obsessive compulsive disorder)    Past Surgical History:  Procedure Laterality Date   DILATION AND CURETTAGE OF UTERUS     TONSILLECTOMY AND ADENOIDECTOMY      Family History  Problem Relation Age of Onset   Depression Mother    Anxiety disorder Mother    Alcohol abuse Mother    Eating disorder Mother    Obesity Father    Heart disease Father    Alcohol abuse Father    Depression Father    Polycystic ovary syndrome Sister    Alcohol abuse Maternal Grandmother    Rheum arthritis Maternal Grandmother    Alcohol abuse Paternal  Grandfather    Depression Paternal Grandfather    Colon cancer Maternal Grandfather    Rheum arthritis Maternal Aunt    Healthy Daughter     Social History   Socioeconomic History   Marital status: Married    Spouse name: Not on file   Number of children: 1   Years of education: Not on file   Highest education level: Not on file  Occupational History   Occupation: homemaker  Tobacco Use   Smoking status: Never   Smokeless tobacco: Never  Vaping Use   Vaping Use: Never used  Substance and Sexual Activity   Alcohol use: No    Alcohol/week: 0.0 standard drinks   Drug use: No   Sexual activity: Yes    Birth control/protection: Pill  Other Topics Concern   Not on file  Social History Narrative   Born in [place], later moved to [place]. She lives with husband and 38 yo daughter Willaim Rayas.   Two older sisters. 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   Social Determinants of Health   Financial Resource Strain: Not on file  Food Insecurity: Not on file  Transportation Needs: Not on file  Physical Activity: Not on file  Stress: Not on file  Social Connections: Not on file  Intimate Partner Violence: Not on file    Current Outpatient Medications on File Prior to Visit  Medication Sig Dispense Refill   Acetaminophen (TYLENOL PO) Take by mouth as needed.     desogestrel-ethinyl estradiol (  APRI) 0.15-30 MG-MCG tablet TAKE 1 TABLET BY MOUTH EVERY DAY     erythromycin ophthalmic ointment Use one half inch four times daily to affected eye (s) x 7 days. 3.5 g 0   FLUoxetine (PROZAC) 10 MG tablet Take 0.5 tablets by mouth See admin instructions.     Iron-Vitamin C 65-125 MG TABS Take 1 tablet by mouth daily. 90 tablet 1   Loratadine 10 MG CAPS Take by mouth. Reported on 09/16/2015     Multiple Vitamin (MULTIVITAMIN ADULT PO) Take by mouth.     sertraline (ZOLOFT) 100 MG tablet Take 3 tablets (300 mg total) by mouth daily. (Patient  taking differently: Take 200 mg by mouth daily.) 90 tablet 0   traZODone (DESYREL) 50 MG tablet TAKE 1 TABLET (50 MG TOTAL) BY MOUTH AT BEDTIME AS NEEDED FOR SLEEP. 90 tablet 1   valACYclovir (VALTREX) 1000 MG tablet Take two tablets ( total 2000 mg) by mouth q12h x 1 day; Start: ASAP after symptom onset 6 tablet 2   No current facility-administered medications on file prior to visit.    Allergies  Allergen Reactions   Clindamycin/Lincomycin Shortness Of Breath   Methocarbamol Shortness Of Breath   Clindamycin Swelling       Observations/Objective: There were no vitals filed for this visit.  There is no height or weight on file to calculate BMI.  Physical Exam Neurological:     Mental Status: She is alert and oriented to person, place, and time.    CBC    Component Value Date/Time   WBC 12.5 (H) 12/29/2020 1342   RBC 5.32 (H) 12/29/2020 1342   HGB 13.1 12/29/2020 1342   HGB 12.8 11/06/2019 0859   HCT 41.9 12/29/2020 1342   HCT 39.4 11/06/2019 0859   PLT 325 12/29/2020 1342   PLT 256 06/29/2012 1059   MCV 78.8 (L) 12/29/2020 1342   MCV 78 (L) 11/06/2019 0859   MCV 78 (L) 06/29/2012 1059   MCH 24.6 (L) 12/29/2020 1342   MCHC 31.3 12/29/2020 1342   RDW 15.0 12/29/2020 1342   RDW 14.5 11/06/2019 0859   RDW 15.0 (H) 06/29/2012 1059   LYMPHSABS 2.3 12/29/2020 1342   LYMPHSABS 2.1 11/06/2019 0859   MONOABS 0.8 12/29/2020 1342   EOSABS 0.3 12/29/2020 1342   EOSABS 0.2 11/06/2019 0859   BASOSABS 0.0 12/29/2020 1342   BASOSABS 0.0 11/06/2019 0859    CMP     Component Value Date/Time   NA 136 03/25/2020 1632   NA 135 (L) 06/29/2012 1059   K 3.9 03/25/2020 1632   K 3.7 06/29/2012 1059   CL 100 03/25/2020 1632   CL 105 06/29/2012 1059   CO2 26 03/25/2020 1632   CO2 21 06/29/2012 1059   GLUCOSE 92 03/25/2020 1632   GLUCOSE 106 (H) 06/29/2012 1059   BUN 13 03/25/2020 1632   BUN 6 (L) 06/29/2012 1059   CREATININE 0.67 03/25/2020 1632   CREATININE 0.50 (L) 06/29/2012  1059   CALCIUM 9.1 03/25/2020 1632   CALCIUM 9.2 06/29/2012 1059   PROT 7.2 10/21/2019 1206   PROT 7.4 06/29/2012 1059   ALBUMIN 4.3 10/21/2019 1206   ALBUMIN 3.3 (L) 06/29/2012 1059   AST 12 10/21/2019 1206   AST 25 06/29/2012 1059   ALT 15 10/21/2019 1206   ALT 37 06/29/2012 1059   ALKPHOS 94 10/21/2019 1206   ALKPHOS 92 06/29/2012 1059   BILITOT 0.4 10/21/2019 1206   BILITOT 0.4 06/29/2012 1059   GFRNONAA >60   06/29/2012 1059   GFRAA >60 06/29/2012 1059     Assessment and Plan: No diagnosis found.  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  Work-up to date has shown a negative hep panel, negative ANA, elevated ESR indicating chronic inflammation.  Flow cytometry is pending.  We will call with results.  Labs today show a white blood cell count of 12.5 which is slightly up from previous.  TSH and free T4 are within normal limits.  We will have her return to clinic in 6 months for follow-up with Dr. Yu.  Microcytosis and iron deficiency- Patient is not anemic although she is deficient in iron.  She is currently taking oral iron supplements twice per day with good tolerance.  There was discussion of receiving some IV iron but that never took place.  Today's labs show a ferritin of 60 and iron saturation 11% which is an improvement from labs from February.  Her MCV has also improved from 76.9-78.8.  Continue oral iron supplements at this time.  I provided 20 minutes of non-face-to-face time during this encounter.  Jennifer E Burns, NP; Jennifer Burns, NP 12/31/2020 3:10 PM  

## 2021-01-02 LAB — COMP PANEL: LEUKEMIA/LYMPHOMA

## 2021-01-10 ENCOUNTER — Encounter: Payer: Self-pay | Admitting: Nurse Practitioner

## 2021-02-02 ENCOUNTER — Ambulatory Visit: Payer: Managed Care, Other (non HMO) | Admitting: Adult Health

## 2021-02-21 ENCOUNTER — Encounter: Payer: Managed Care, Other (non HMO) | Admitting: Adult Health

## 2021-02-26 ENCOUNTER — Encounter: Payer: Self-pay | Admitting: Oncology

## 2021-02-26 ENCOUNTER — Encounter: Payer: Self-pay | Admitting: Physician Assistant

## 2021-02-26 ENCOUNTER — Emergency Department: Payer: BC Managed Care – PPO

## 2021-02-26 ENCOUNTER — Other Ambulatory Visit: Payer: Self-pay

## 2021-02-26 ENCOUNTER — Emergency Department
Admission: EM | Admit: 2021-02-26 | Discharge: 2021-02-26 | Disposition: A | Discharge status: Home / Self Care | Payer: BC Managed Care – PPO | Attending: Emergency Medicine | Admitting: Emergency Medicine

## 2021-02-26 DIAGNOSIS — Y92007 Garden or yard of unspecified non-institutional (private) residence as the place of occurrence of the external cause: Secondary | ICD-10-CM | POA: Insufficient documentation

## 2021-02-26 DIAGNOSIS — S93401A Sprain of unspecified ligament of right ankle, initial encounter: Secondary | ICD-10-CM | POA: Diagnosis not present

## 2021-02-26 DIAGNOSIS — S93601A Unspecified sprain of right foot, initial encounter: Secondary | ICD-10-CM | POA: Diagnosis not present

## 2021-02-26 DIAGNOSIS — X501XXA Overexertion from prolonged static or awkward postures, initial encounter: Secondary | ICD-10-CM | POA: Insufficient documentation

## 2021-02-26 DIAGNOSIS — Y9301 Activity, walking, marching and hiking: Secondary | ICD-10-CM | POA: Insufficient documentation

## 2021-02-26 DIAGNOSIS — S99921A Unspecified injury of right foot, initial encounter: Secondary | ICD-10-CM | POA: Diagnosis present

## 2021-02-26 MED ORDER — IBUPROFEN 800 MG PO TABS: 800.0000 mg | ORAL_TABLET | Freq: Three times a day (TID) | ORAL | 0 refills | Status: DC | PRN

## 2021-02-26 MED ORDER — TRAMADOL HCL 50 MG PO TABS: 50.0000 mg | ORAL_TABLET | Freq: Three times a day (TID) | ORAL | 0 refills | Status: AC | PRN

## 2021-02-26 MED ORDER — TRAMADOL HCL 50 MG PO TABS
50.0000 mg | ORAL_TABLET | Freq: Once | ORAL | Status: AC
  Administered 2021-02-26: 50 mg via ORAL
  Filled 2021-02-26: qty 1

## 2021-02-26 NOTE — ED Notes (Signed)
Pt in for stepping into hole with R foot and rolling ankle. Dorsalis pedis 1+; foot warm; cap refill less than 3 sec. Visitor at bedside.

## 2021-02-26 NOTE — ED Triage Notes (Signed)
Reports was walking in a field and stepped in a hole and twisted right foot and fell.

## 2021-02-26 NOTE — Discharge Instructions (Addendum)
Rest with the foot elevated and take the pain medicines as prescribed.  Follow-up with podiatry for ongoing management of your ankle/foot sprain.

## 2021-02-26 NOTE — ED Provider Notes (Signed)
[facility] Emergency Department Provider Note ____________________________________________  Time seen: 2225  I have reviewed the triage vital signs and the nursing notes.  HISTORY  Chief Complaint  Foot Pain   HPI Nancy Molina is a 30 y.o. female with below medical history, presents to the ED from home, after stepping in a divot in the yard, twisting her right foot and ankle.  She did not describes a mechanical fall after the incident, but denies any head injury or LOC.  She presents with pain to the dorsal right foot and ankle that is aggravated by attempts to bear weight.  She denies any other injury at this time.  Past Medical History:  Diagnosis Date   Anxiety    Depression    IBS (irritable bowel syndrome)    Obesity    Obsessive-compulsive disorder    OCD (obsessive compulsive disorder)     Patient Active Problem List   Diagnosis Date Noted   Otitis media 12/10/2020   Dysphagia 12/08/2020   Hordeolum externum 12/08/2020   Localized superficial swelling, mass, or lump 12/08/2020   Other specified dermatoses 12/08/2020   Regular astigmatism 12/08/2020   Lesion of left lower eyelid 12/08/2020   Facial swelling 12/08/2020   Iron deficiency 07/01/2020   BMI 70 and over, adult ([place]) 03/25/2020   Prediabetes 12/17/2019   ESR raised 16/02/9603   Umbilical pain 54/01/8118   Umbilical hernia without obstruction and without gangrene 12/02/2019   Leukocytosis 11/18/2019   Anxiety with depression 10/22/2019   Morbid obesity ([place]) 10/22/2019   Annual physical exam 10/22/2019   Myopia, bilateral 03/25/2019   Myopia 02/08/2012    Past Surgical History:  Procedure Laterality Date   DILATION AND CURETTAGE OF [place]      Prior to Admission medications   Medication Sig Start Date End Date Taking? Authorizing Provider  ibuprofen (ADVIL) 800 MG tablet Take 1 tablet (800 mg total) by mouth every 8 (eight) hours  as needed. 02/26/21  Yes Jonothan Heberle, Dannielle Karvonen, PA-C  traMADol (ULTRAM) 50 MG tablet Take 1 tablet (50 mg total) by mouth 3 (three) times daily as needed for up to 5 days. 02/26/21 03/03/21 Yes Kathya Wilz, Dannielle Karvonen, PA-C  Acetaminophen (TYLENOL PO) Take by mouth as needed.    [provider]  desogestrel-ethinyl estradiol (APRI) 0.15-30 MG-MCG tablet TAKE 1 TABLET BY MOUTH EVERY DAY 03/25/19   [provider]  erythromycin ophthalmic ointment Use one half inch four times daily to affected eye (s) x 7 days. 12/08/20   Burnard Hawthorne, FNP  FLUoxetine (PROZAC) 10 MG tablet Take 0.5 tablets by mouth See admin instructions. 11/30/20   [provider]  Iron-Vitamin C 65-125 MG TABS Take 1 tablet by mouth daily. 11/29/19   Earlie Server, MD  Loratadine 10 MG CAPS Take by mouth. Reported on 09/16/2015    [provider]  Multiple Vitamin (MULTIVITAMIN ADULT PO) Take by mouth.    [provider]  sertraline (ZOLOFT) 100 MG tablet Take 3 tablets (300 mg total) by mouth daily. Patient taking differently: Take 200 mg by mouth daily. 10/06/20 04/04/21  Arfeen, Arlyce Harman, MD  traZODone (DESYREL) 50 MG tablet TAKE 1 TABLET (50 MG TOTAL) BY MOUTH AT BEDTIME AS NEEDED FOR SLEEP. 06/22/20 12/19/20  Pucilowski, Marchia Bond, MD  valACYclovir (VALTREX) 1000 MG tablet Take two tablets ( total 2000 mg) by mouth q12h x 1 day; Start: ASAP after symptom onset 12/08/20  Burnard Hawthorne, FNP    Allergies Clindamycin, Clindamycin/lincomycin, and Methocarbamol  Family History  Problem Relation Age of Onset   Depression Mother    Anxiety disorder Mother    Alcohol abuse Mother    Eating disorder Mother    Obesity Father    Heart disease Father    Alcohol abuse Father    Depression Father    Polycystic ovary syndrome Sister    Alcohol abuse Maternal Grandmother    Rheum arthritis Maternal Grandmother    Alcohol abuse Paternal Grandfather    Depression Paternal Grandfather     Colon cancer Maternal Grandfather    Rheum arthritis Maternal Aunt    Healthy Daughter     Social History Social History   Tobacco Use   Smoking status: Never   Smokeless tobacco: Never  Vaping Use   Vaping Use: Never used  Substance Use Topics   Alcohol use: No    Alcohol/week: 0.0 standard drinks   Drug use: No    Review of Systems  Constitutional: Negative for fever. Cardiovascular: Negative for chest pain. Respiratory: Negative for shortness of breath. Gastrointestinal: Negative for abdominal pain, vomiting and diarrhea. Genitourinary: Negative for dysuria. Musculoskeletal: Negative for back pain.  Right ankle foot pain as above. Skin: Negative for rash. Neurological: Negative for headaches, focal weakness or numbness. ____________________________________________  PHYSICAL EXAM:  VITAL SIGNS: ED Triage Vitals [02/26/21 2003]  Enc Vitals Group     BP (!) 157/101     Pulse Rate (!) 110     Resp 18     Temp 98.4 F (36.9 C)     Temp Source Oral     SpO2 94 %     Weight (!) 420 lb (190.5 kg)     Height 5' 4"  (1.626 m)     Head Circumference      Peak Flow      Pain Score 10     Pain Loc      Pain Edu?      Excl. in [place]?     Constitutional: Alert and oriented. Well appearing and in no distress. Head: Normocephalic and atraumatic. Eyes: Conjunctivae are normal. Normal extraocular movements Cardiovascular: Normal rate, regular rhythm. Normal distal pulses. Respiratory: Normal respiratory effort. No wheezes/rales/rhonchi. Gastrointestinal: Soft and nontender. No distention. Musculoskeletal: Right ankle and foot without obvious deformity or dislocation.  No ecchymosis or erythema is appreciated.  Patient able demonstrate with flexion extension range on exam.  Negative anterior/posterior drawer sign.  No popliteal space fullness no calf or Achilles tenderness is elicited.  Nontender with normal range of motion in all extremities.  Neurologic: Normal gross  sensation. Normal speech and language. No gross focal neurologic deficits are appreciated. Skin:  Skin is warm, dry and intact. No rash noted. Psychiatric: Mood and affect are normal. Patient exhibits appropriate insight and judgment. ____________________________________________    {LABS (pertinent positives/negatives)  ____________________________________________  {EKG  ____________________________________________   RADIOLOGY Official radiology report(s): DG Foot Complete Right  Result Date: 02/26/2021 CLINICAL DATA:  Fall, foot pain EXAM: RIGHT FOOT COMPLETE - 3+ VIEW COMPARISON:  None. FINDINGS: There is no evidence of fracture or dislocation. There is no evidence of arthropathy or other focal bone abnormality. Soft tissues are unremarkable. IMPRESSION: Negative. Electronically Signed   By: Rolm Baptise M.D.   On: 02/26/2021 20:36   ____________________________________________  PROCEDURES   Procedures ____________________________________________   INITIAL IMPRESSION / ASSESSMENT AND PLAN / ED COURSE  As part of my medical decision making,  I reviewed the following data within the [facility] reviewed WNL and Notes from prior ED visits   Patient ED evaluation of mechanical injury resulting in right ankle pain.  She is evaluated for complaint, found have a normal reassuring ankle exam, and a negative foot x-ray.  No radiologic evidence of any acute fracture or dislocation.  Patient is placed in a stirrup splint, and given crutches to ambulate with weightbearing as tolerated.  She is referred to podiatry for further management of her ankle pain.  Prescription for Ultram and ibuprofen was provided for her benefit.   Donald Prose Geving was evaluated in Emergency Department on 02/26/2021 for the symptoms described in the history of present illness. She was evaluated in the context of the global COVID-19 pandemic, which necessitated consideration that the  patient might be at risk for infection with the SARS-CoV-2 virus that causes COVID-19. Institutional protocols and algorithms that pertain to the evaluation of patients at risk for COVID-19 are in a state of rapid change based on information released by regulatory bodies including the CDC and federal and state organizations. These policies and algorithms were followed during the patient's care in the ED. ____________________________________________  FINAL CLINICAL IMPRESSION(S) / ED DIAGNOSES  Final diagnoses:  Sprain of right foot, initial encounter  Sprain of right ankle, unspecified ligament, initial encounter      Melvenia Needles, PA-C 02/26/21 2313    Duffy Bruce, MD 02/27/21 1510

## 2021-02-26 NOTE — ED Notes (Signed)
Pt verbalized understanding of dc instructions, scripts and crutch use. No questions or concerns at this time. Pt remained in wheelchair and was wheeled to lobby for transport by friend.

## 2021-03-01 ENCOUNTER — Encounter: Payer: Self-pay | Admitting: Oncology

## 2021-03-18 ENCOUNTER — Other Ambulatory Visit (HOSPITAL_BASED_OUTPATIENT_CLINIC_OR_DEPARTMENT_OTHER): Payer: Self-pay

## 2021-03-18 DIAGNOSIS — R5383 Other fatigue: Secondary | ICD-10-CM

## 2021-03-18 DIAGNOSIS — G4719 Other hypersomnia: Secondary | ICD-10-CM

## 2021-03-18 DIAGNOSIS — R0683 Snoring: Secondary | ICD-10-CM

## 2021-03-23 ENCOUNTER — Encounter: Payer: Managed Care, Other (non HMO) | Admitting: Adult Health

## 2021-06-20 ENCOUNTER — Other Ambulatory Visit: Payer: Self-pay | Admitting: *Deleted

## 2021-06-20 DIAGNOSIS — E611 Iron deficiency: Secondary | ICD-10-CM

## 2021-06-24 ENCOUNTER — Encounter: Payer: Self-pay | Admitting: Oncology

## 2021-06-28 ENCOUNTER — Telehealth: Payer: Self-pay | Admitting: *Deleted

## 2021-06-28 NOTE — Telephone Encounter (Signed)
duplicate

## 2021-06-28 NOTE — Telephone Encounter (Signed)
Spoke to pt, she stated she would call back to reschedule

## 2021-06-28 NOTE — Telephone Encounter (Signed)
Patient needs to reschedule 2 appointments.

## 2021-06-29 ENCOUNTER — Encounter: Payer: Self-pay | Admitting: Oncology

## 2021-07-01 ENCOUNTER — Inpatient Hospital Stay: Payer: Self-pay

## 2021-07-04 ENCOUNTER — Telehealth: Payer: Managed Care, Other (non HMO) | Admitting: Oncology

## 2021-10-21 IMAGING — CR DG SI JOINTS 3+V
1 series · 3 of 3 positions shown · non-contrast
Comparison: 12/04/2019

CLINICAL DATA: Low back pain

EXAM:
BILATERAL SACROILIAC JOINTS - 3+ VIEW

[Series 1: dg si joints · 0.14mm/px · 3 of 3 slices shown]
[im 1/3]
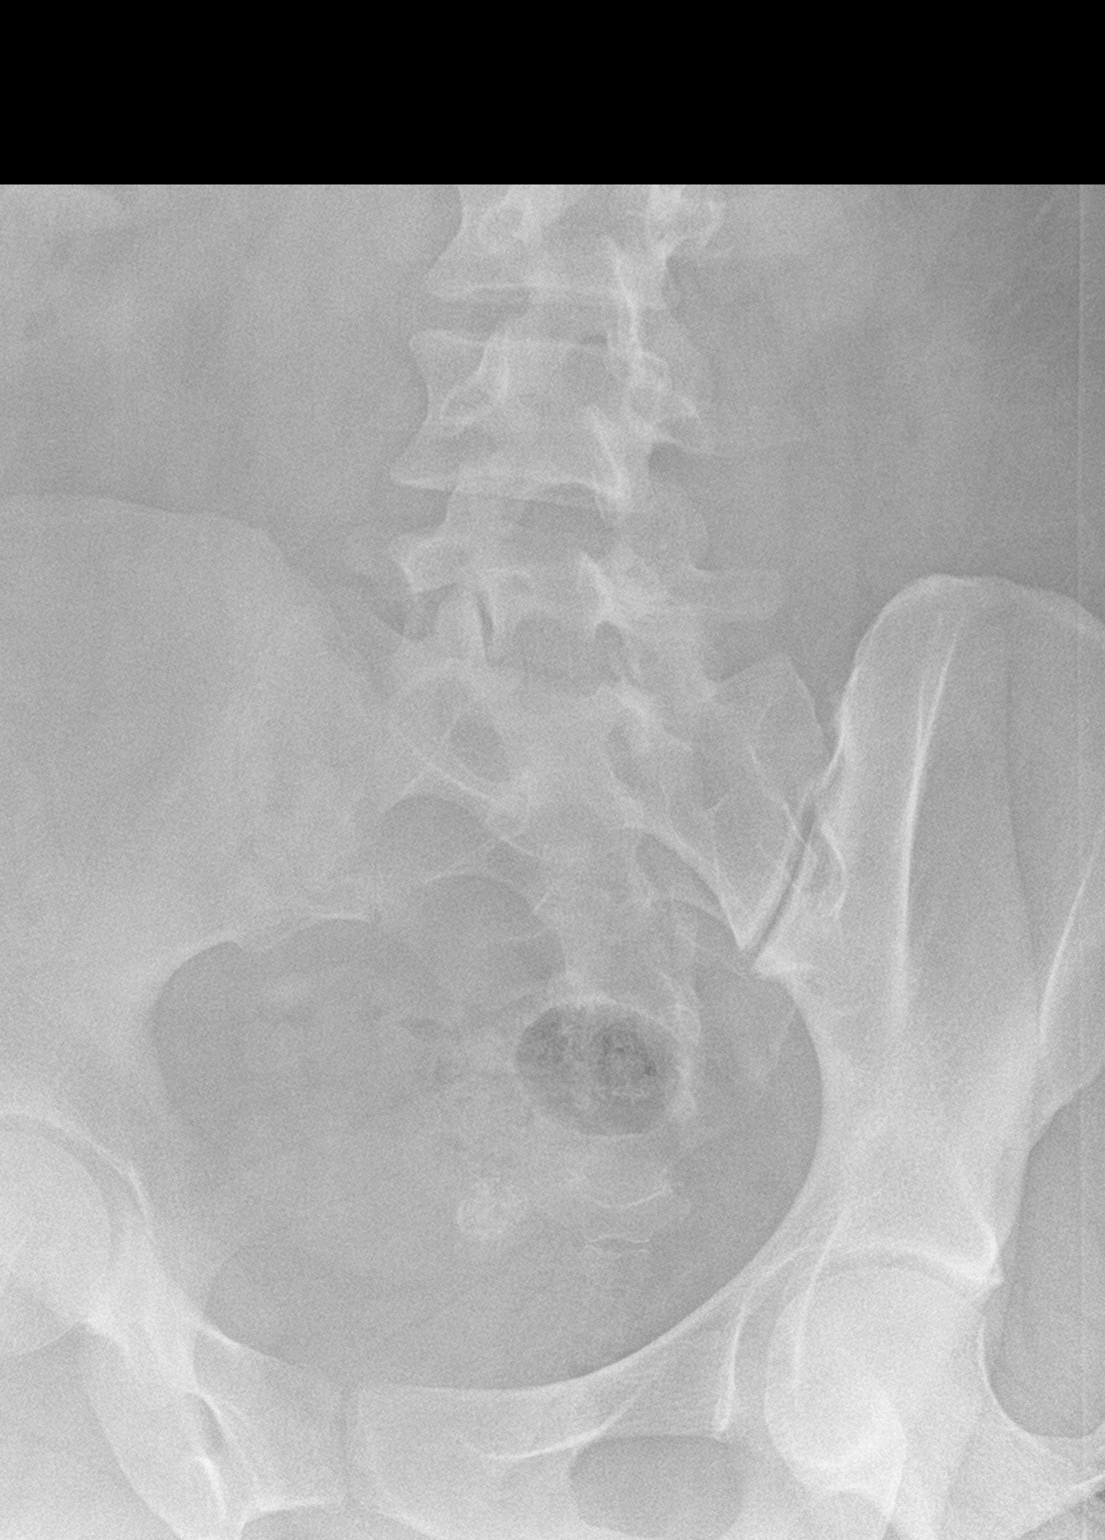
[im 2/3]
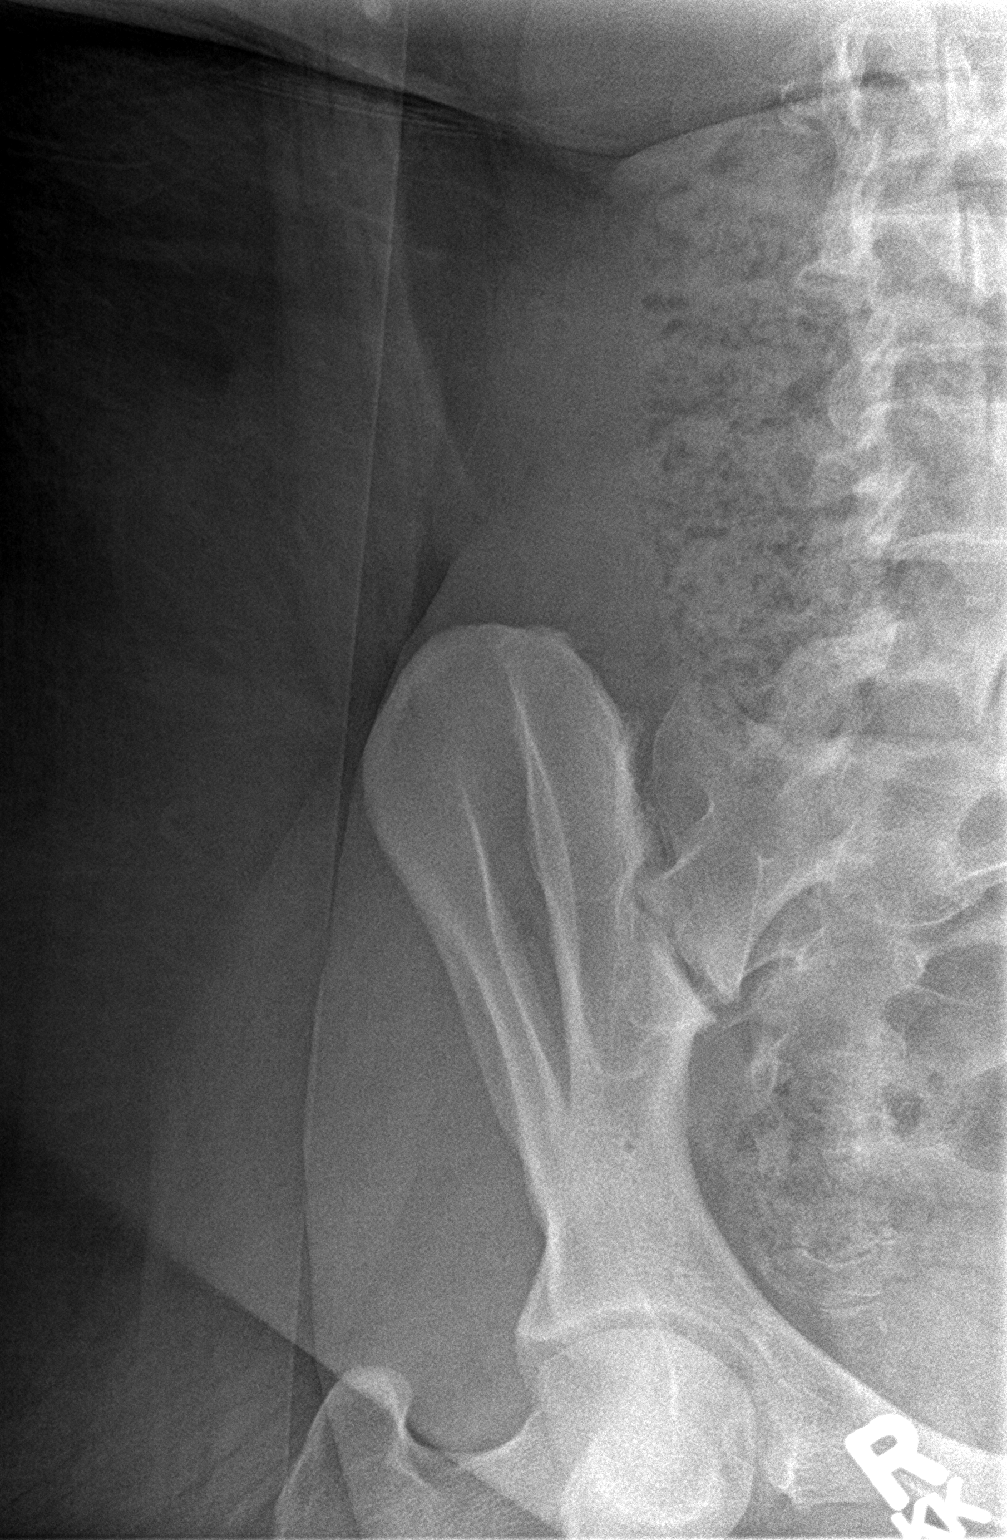
[im 3/3]
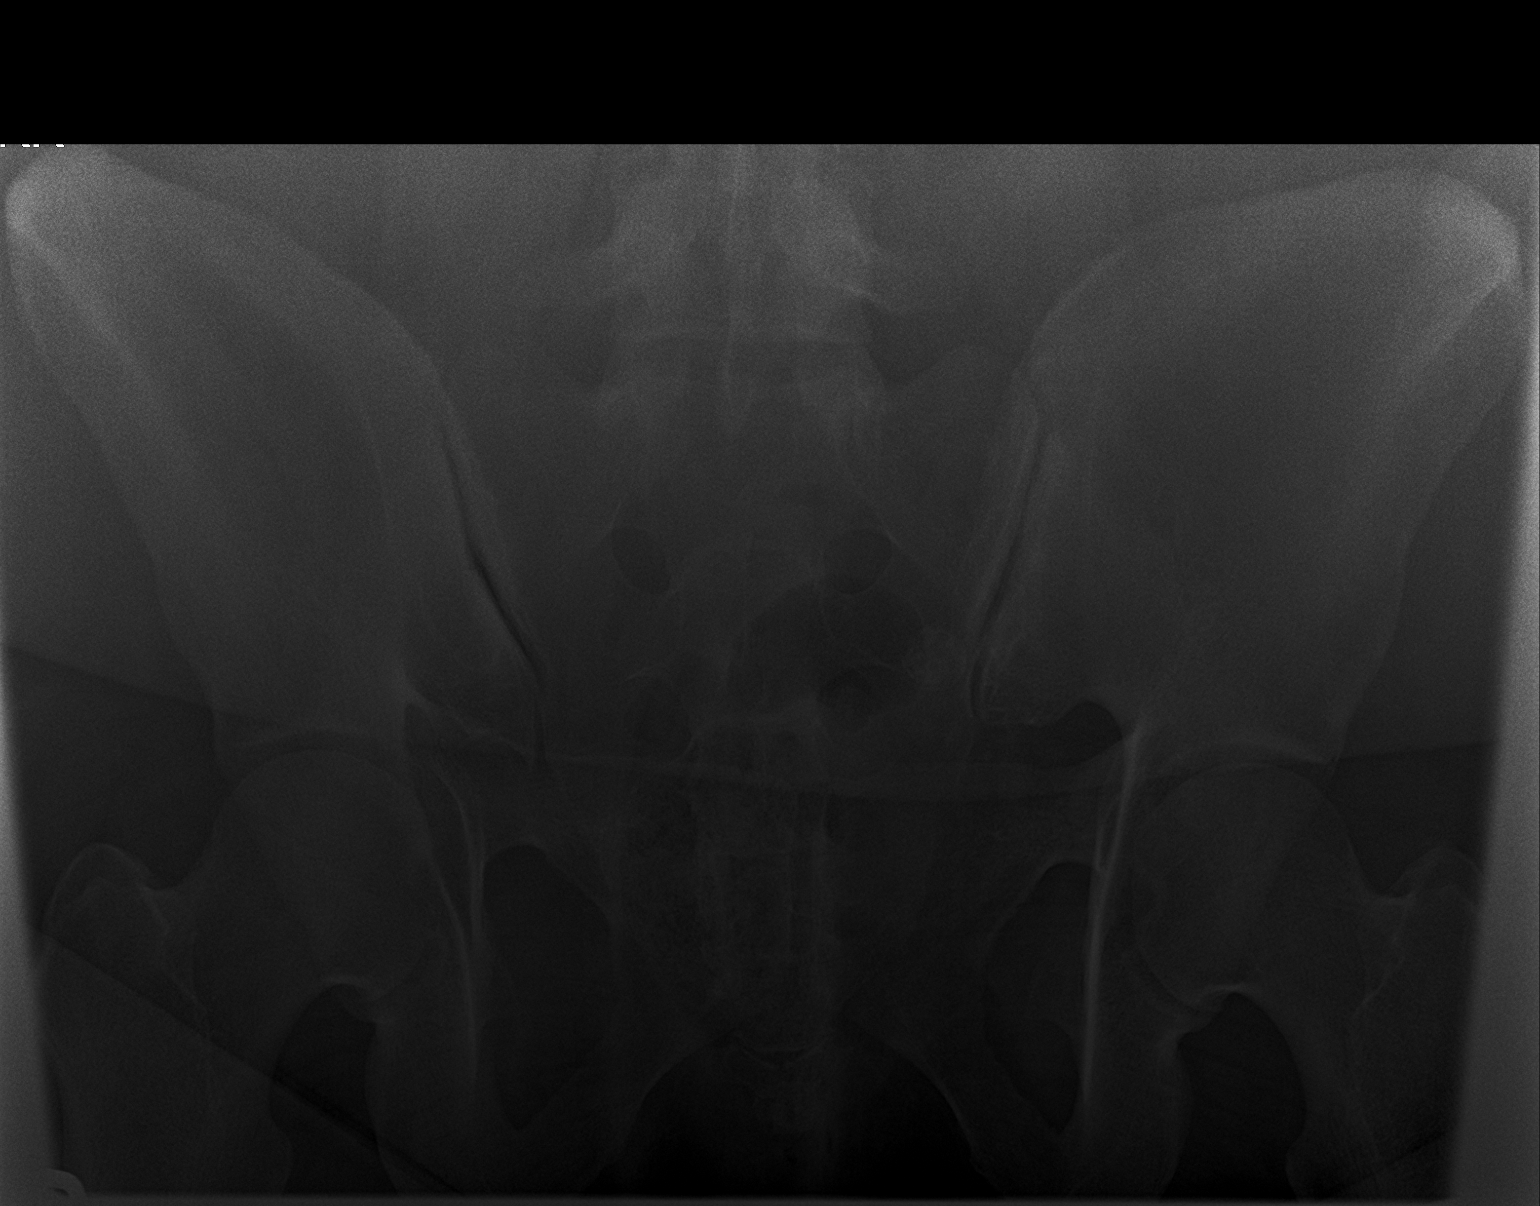

[3 of 3 positions shown; findings below may reference images not displayed]

FINDINGS: The sacroiliac joint spaces are maintained. No ankylosis. Mild
sclerotic changes of the bilateral SI joints are favored to reflect
early degenerative changes rather than sacroiliitis. No erosions are
seen. No other bone abnormalities are seen.
IMPRESSION: Mild sclerotic changes of the bilateral SI joints, favored to
reflect early degenerative changes rather than sacroiliitis.

## 2021-12-21 ENCOUNTER — Encounter (INDEPENDENT_AMBULATORY_CARE_PROVIDER_SITE_OTHER): Payer: Self-pay

## 2023-02-26 DIAGNOSIS — F411 Generalized anxiety disorder: Secondary | ICD-10-CM | POA: Diagnosis not present

## 2023-03-01 DIAGNOSIS — F411 Generalized anxiety disorder: Secondary | ICD-10-CM | POA: Diagnosis not present

## 2023-03-01 DIAGNOSIS — F330 Major depressive disorder, recurrent, mild: Secondary | ICD-10-CM | POA: Diagnosis not present

## 2023-03-01 DIAGNOSIS — F4310 Post-traumatic stress disorder, unspecified: Secondary | ICD-10-CM | POA: Diagnosis not present

## 2023-03-04 DIAGNOSIS — R030 Elevated blood-pressure reading, without diagnosis of hypertension: Secondary | ICD-10-CM | POA: Diagnosis not present

## 2023-03-04 DIAGNOSIS — S8002XA Contusion of left knee, initial encounter: Secondary | ICD-10-CM | POA: Diagnosis not present

## 2023-03-04 DIAGNOSIS — M25562 Pain in left knee: Secondary | ICD-10-CM | POA: Diagnosis not present

## 2023-03-19 DIAGNOSIS — S8002XA Contusion of left knee, initial encounter: Secondary | ICD-10-CM | POA: Diagnosis not present

## 2023-03-29 DIAGNOSIS — F411 Generalized anxiety disorder: Secondary | ICD-10-CM | POA: Diagnosis not present

## 2023-04-09 DIAGNOSIS — F411 Generalized anxiety disorder: Secondary | ICD-10-CM | POA: Diagnosis not present

## 2023-04-10 DIAGNOSIS — S8002XA Contusion of left knee, initial encounter: Secondary | ICD-10-CM | POA: Diagnosis not present

## 2023-04-17 DIAGNOSIS — G4733 Obstructive sleep apnea (adult) (pediatric): Secondary | ICD-10-CM | POA: Diagnosis not present

## 2023-04-20 ENCOUNTER — Encounter: Payer: Self-pay | Admitting: Nurse Practitioner

## 2023-04-20 ENCOUNTER — Telehealth: Payer: Self-pay

## 2023-04-20 ENCOUNTER — Encounter: Payer: Self-pay | Admitting: Oncology

## 2023-04-20 ENCOUNTER — Ambulatory Visit (INDEPENDENT_AMBULATORY_CARE_PROVIDER_SITE_OTHER): Payer: BC Managed Care – PPO | Admitting: Nurse Practitioner

## 2023-04-20 VITALS — BP 140/82 | HR 79 | Temp 98.0°F | Ht 65.0 in | Wt >= 6400 oz

## 2023-04-20 DIAGNOSIS — F418 Other specified anxiety disorders: Secondary | ICD-10-CM

## 2023-04-20 DIAGNOSIS — Z0001 Encounter for general adult medical examination with abnormal findings: Secondary | ICD-10-CM

## 2023-04-20 DIAGNOSIS — Z6841 Body Mass Index (BMI) 40.0 and over, adult: Secondary | ICD-10-CM | POA: Diagnosis not present

## 2023-04-20 DIAGNOSIS — E611 Iron deficiency: Secondary | ICD-10-CM | POA: Diagnosis not present

## 2023-04-20 DIAGNOSIS — Z13220 Encounter for screening for lipoid disorders: Secondary | ICD-10-CM

## 2023-04-20 DIAGNOSIS — Z1329 Encounter for screening for other suspected endocrine disorder: Secondary | ICD-10-CM | POA: Diagnosis not present

## 2023-04-20 DIAGNOSIS — R7303 Prediabetes: Secondary | ICD-10-CM | POA: Diagnosis not present

## 2023-04-20 DIAGNOSIS — R030 Elevated blood-pressure reading, without diagnosis of hypertension: Secondary | ICD-10-CM | POA: Diagnosis not present

## 2023-04-20 LAB — LIPID PANEL
Cholesterol: 208 mg/dL — ABNORMAL HIGH (ref 0–200)
HDL: 48.8 mg/dL (ref >39.00)
LDL Cholesterol: 128 mg/dL — ABNORMAL HIGH (ref 0–99)
NonHDL: 159.28
Total CHOL/HDL Ratio: 4
Triglycerides: 156 mg/dL — ABNORMAL HIGH (ref 0.0–149.0)
VLDL: 31.2 mg/dL (ref 0.0–40.0)

## 2023-04-20 LAB — VITAMIN D 25 HYDROXY (VIT D DEFICIENCY, FRACTURES): VITD: 31.16 ng/mL (ref 30.00–100.00)

## 2023-04-20 LAB — CBC WITH DIFFERENTIAL/PLATELET
Basophils Absolute: 0 10*3/uL (ref 0.0–0.1)
Basophils Relative: 0.3 % (ref 0.0–3.0)
Eosinophils Absolute: 0.3 10*3/uL (ref 0.0–0.7)
Eosinophils Relative: 2.2 % (ref 0.0–5.0)
HCT: 41.1 % (ref 36.0–46.0)
Hemoglobin: 13.9 g/dL (ref 12.0–15.0)
Lymphocytes Relative: 14 % (ref 12.0–46.0)
Lymphs Abs: 1.6 10*3/uL (ref 0.7–4.0)
MCHC: 33.9 g/dL (ref 30.0–36.0)
MCV: 80.8 fL (ref 78.0–100.0)
Monocytes Absolute: 0.9 10*3/uL (ref 0.1–1.0)
Monocytes Relative: 7.5 % (ref 3.0–12.0)
Neutro Abs: 8.9 10*3/uL — ABNORMAL HIGH (ref 1.4–7.7)
Neutrophils Relative %: 76 % (ref 43.0–77.0)
Platelets: 328 10*3/uL (ref 150.0–400.0)
RBC: 5.08 Mil/uL (ref 3.87–5.11)
RDW: 15.7 % — ABNORMAL HIGH (ref 11.5–15.5)
WBC: 11.7 10*3/uL — ABNORMAL HIGH (ref 4.0–10.5)

## 2023-04-20 LAB — COMPREHENSIVE METABOLIC PANEL
ALT: 21 U/L (ref 0–35)
AST: 13 U/L (ref 0–37)
Albumin: 4.3 g/dL (ref 3.5–5.2)
Alkaline Phosphatase: 117 U/L (ref 39–117)
BUN: 16 mg/dL (ref 6–23)
CO2: 31 meq/L (ref 19–32)
Calcium: 9.2 mg/dL (ref 8.4–10.5)
Chloride: 99 meq/L (ref 96–112)
Creatinine, Ser: 0.57 mg/dL (ref 0.40–1.20)
GFR: 119.12 mL/min (ref >60.00)
Glucose, Bld: 96 mg/dL (ref 70–99)
Potassium: 4.4 meq/L (ref 3.5–5.1)
Sodium: 138 meq/L (ref 135–145)
Total Bilirubin: 0.3 mg/dL (ref 0.2–1.2)
Total Protein: 7.4 g/dL (ref 6.0–8.3)

## 2023-04-20 LAB — IBC + FERRITIN
Ferritin: 87.3 ng/mL (ref 10.0–291.0)
Iron: 151 ug/dL — ABNORMAL HIGH (ref 42–145)
Saturation Ratios: 41.5 % (ref 20.0–50.0)
TIBC: 364 ug/dL (ref 250.0–450.0)
Transferrin: 260 mg/dL (ref 212.0–360.0)

## 2023-04-20 LAB — HEMOGLOBIN A1C: Hgb A1c MFr Bld: 6 % (ref 4.6–6.5)

## 2023-04-20 LAB — TSH: TSH: 2.51 u[IU]/mL (ref 0.35–5.50)

## 2023-04-20 MED ORDER — ZEPBOUND 2.5 MG/0.5ML ~~LOC~~ SOAJ: 2.5000 mg | SUBCUTANEOUS | 0 refills | Status: DC

## 2023-04-20 MED ORDER — ZEPBOUND 5 MG/0.5ML ~~LOC~~ SOAJ
5.0000 mg | SUBCUTANEOUS | 0 refills | Status: DC
Start: 2023-04-20 — End: 2023-08-01

## 2023-04-20 MED ORDER — ZEPBOUND 7.5 MG/0.5ML ~~LOC~~ SOAJ: 7.5000 mg | SUBCUTANEOUS | 0 refills | Status: DC

## 2023-04-20 NOTE — Assessment & Plan Note (Signed)
Elevated blood pressure was noted in the office x 2 readings, and she has a home blood pressure monitor. We will provide a blood pressure log and instruct her to monitor her blood pressure daily at home. Blood pressure control will be re-evaluated at the next visit or sooner if consistently elevated readings over 140/90 are noted at home. Anticipate improvement with weight loss and improved diet, advised decreasing sodium intake.

## 2023-04-20 NOTE — Assessment & Plan Note (Signed)
She has a history of binge eating disorder. Due to difficulties initiating physical activity because of her current weight and knee pain, she is interested in weight loss medications. We will initiate Zepbound at the lowest dose, titrating up as tolerated, encourage continued dietary modifications, and increased physical activity as tolerated. Counseled on the risk of pancreatitis and gallbladder disease. Discussed the risk of nausea. Advised to discontinue the Zepbound and contact us immediately if they develop abdominal pain. If they develop excessive nausea they will contact us right away. I discussed that medullary thyroid cancer has been seen in rats studies. The patient confirmed no personal or family history of thyroid cancer, parathyroid cancer, or adrenal gland cancer. Discussed that we thus far have not seen medullary thyroid cancer result from use of this type of medication in humans. Advised to monitor the thyroid area and contact us for any lumps, swelling, trouble swallowing, or any other changes in this area.  Discussed goal weight loss of 1 to 2 pounds a week while on this medication. Discussed the importance of healthy diet, exercise and lifestyle modifications even with this medication. A follow-up is scheduled in 6-8 weeks to assess the tolerance and effectiveness of the medication.

## 2023-04-20 NOTE — Assessment & Plan Note (Addendum)
Physical exam complete. We will order routine lab work as outlined and contact the patient with results. Management of her mood disorder will continue with Psychiatry, and management of left knee pain will continue with Orthopedics. She declined the flu vaccine today and has declined all COVID vaccines. She is due for her tetanus vaccine and will get this at her next appointment. We encourage her to follow up with OBGYN for a routine Pap smear in 2025 and to see her Dentist and Optometrist for routine care. Encouraged to continue working on healthy diet and exercise as tolerated. Return to care in 6-8 weeks, sooner as needed.

## 2023-04-20 NOTE — Assessment & Plan Note (Signed)
Her anxiety and depression is well controlled on Prozac 60 mg daily. PHQ- 6 and GAD- 7 today. She will continue Prozac and follow up with her Psychiatrist as scheduled.

## 2023-04-20 NOTE — Telephone Encounter (Signed)
 Pt needs a PA on Zepbound

## 2023-04-20 NOTE — Progress Notes (Signed)
Nancy Dicker, NP-C Phone: [phone]  Nancy Molina is a 32 y.o. female who presents today to establish care.   Discussed the use of AI scribe software for clinical note transcription with the patient, who gave verbal consent to proceed.  History of Present Illness   The patient, with a past medical history of iron deficiency anemia, presents for a new patient consultation. She reports no significant medical conditions such as hypertension, diabetes, or hyperlipidemia. She has been on oral iron supplementation daily due to the anemia, but the etiology remains unclear as she does not have heavy menstrual periods and has not seen a gastroenterologist.  The patient also has a history of left knee issues, for which she is currently under the care of orthopedics and taking meloxicam. She is also on Prozac, managed by her long-term psychiatrist, for mood stabilization.  The patient has been diagnosed with binge eating disorder in 2016 and has struggled with weight issues for most of her life. She expresses a desire to start a weight loss medication to assist with initial weight loss and facilitate increased physical activity, which is currently limited due to her weight and knee issues. She has concerns about potential side effects, as her mother had a negative experience with Wegovy, including severe vomiting that led to hospitalization. She has been working on diet, decreasing her fast food intake and cooking more at home. She has joined the [employer] and plans to begin exercising once her knee issue is resolved.   The patient also reports a history of postpartum preeclampsia and currently has a home blood pressure monitor. She has noticed elevated readings during doctor's visits but does not regularly monitor at home.  Lastly, the patient has sleep apnea and uses a CPAP machine, which has improved her sleep quality. She has not received any COVID vaccines and is due for a tetanus shot. She is also  due for a Pap smear in 2025, which is managed by her OBGYN.      Active Ambulatory Problems    Diagnosis Date Noted   Anxiety with depression 10/22/2019   Encounter for routine adult medical exam with abnormal findings 10/22/2019   Leukocytosis 11/18/2019   Umbilical pain 12/02/2019   Umbilical hernia without obstruction and without gangrene 12/02/2019   Prediabetes 12/17/2019   ESR raised 12/17/2019   Myopia, bilateral 03/25/2019   BMI 70 and over, adult (HCC) 03/25/2020   Iron deficiency 07/01/2020   Dysphagia 12/08/2020   Hordeolum externum 12/08/2020   Localized superficial swelling, mass, or lump 12/08/2020   Other specified dermatoses 12/08/2020   Regular astigmatism 12/08/2020   Lesion of left lower eyelid 12/08/2020   Facial swelling 12/08/2020   Otitis media 12/10/2020   Elevated blood pressure reading in office without diagnosis of hypertension 04/20/2023   Resolved Ambulatory Problems    Diagnosis Date Noted   Major depressive disorder, recurrent episode, in full remission (HCC) 08/05/2015   OCD (obsessive compulsive disorder) 08/05/2015   Morbid obesity (HCC) 10/22/2019   Myopia 02/08/2012   Past Medical History:  Diagnosis Date   Allergy 2010   Anemia Teenage (early 2000s)   Anxiety    Depression    Hypertension 2014   IBS (irritable bowel syndrome)    Obesity    Obsessive-compulsive disorder    Sleep apnea 2023    Family History  Problem Relation Age of Onset   Depression Mother    Anxiety disorder Mother    Alcohol abuse Mother    Eating  disorder Mother    Obesity Father    Heart disease Father    Alcohol abuse Father    Depression Father    Early death Father    Polycystic ovary syndrome Sister    Alcohol abuse Maternal Grandmother    Rheum arthritis Maternal Grandmother    Arthritis Maternal Grandmother    Alcohol abuse Paternal Grandfather    Depression Paternal Grandfather    Colon cancer Maternal Grandfather    Rheum arthritis  Maternal Aunt    Healthy Daughter    Alcohol abuse Paternal Grandmother    Depression Paternal Grandmother    Asthma Daughter    Early death Paternal Aunt     Social History   Socioeconomic History   Marital status: Married    Spouse name: Not on file   Number of children: 1   Years of education: Not on file   Highest education level: Some college, no degree  Occupational History   Occupation: homemaker  Tobacco Use   Smoking status: Never   Smokeless tobacco: Never  Vaping Use   Vaping status: Never Used  Substance and Sexual Activity   Alcohol use: No   Drug use: No   Sexual activity: Yes    Birth control/protection: Pill  Other Topics Concern   Not on file  Social History Narrative   Born in [place], later moved to [place]. She lives with husband and 13 yo daughter Nancy Molina.   Two older sisters. 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   Social Determinants of Health   Financial Resource Strain: Low Risk  (04/15/2023)   Overall Financial Resource Strain (CARDIA)    Difficulty of Paying Living Expenses: Not very hard  Food Insecurity: No Food Insecurity (04/15/2023)   Hunger Vital Sign    Worried About Running Out of Food in the Last Year: Never true    Ran Out of Food in the Last Year: Never true  Transportation Needs: No Transportation Needs (04/15/2023)   PRAPARE - Administrator, Civil Service (Medical): No    Lack of Transportation (Non-Medical): No  Physical Activity: Insufficiently Active (04/15/2023)   Exercise Vital Sign    Days of Exercise per Week: 2 days    Minutes of Exercise per Session: 20 min  Stress: Stress Concern Present (04/15/2023)   [employer] of Occupational Health - Occupational Stress Questionnaire    Feeling of Stress : Very much  Social Connections: Moderately Isolated (04/15/2023)   Social Connection and Isolation Panel [NHANES]    Frequency of Communication with  Friends and Family: Twice a week    Frequency of Social Gatherings with Friends and Family: Once a week    Attends Religious Services: Never    Database administrator or Organizations: No    Attends Engineer, structural: Not on file    Marital Status: Married  Catering manager Violence: Not on file    ROS  General:  Negative for unexplained weight loss, fever Skin: Negative for new or changing mole, sore that won't heal HEENT: Negative for trouble hearing, trouble seeing, ringing in ears, mouth sores, hoarseness, change in voice, dysphagia. CV:  Negative for chest pain, dyspnea, edema, palpitations Resp: Negative for cough, dyspnea, hemoptysis GI: Negative for nausea, vomiting, diarrhea, constipation, abdominal pain, melena, hematochezia. GU: Negative for dysuria, incontinence, urinary hesitance, hematuria, vaginal or penile discharge, polyuria, sexual difficulty, lumps in testicle or breasts MSK: Negative for muscle  cramps or aches, joint pain or swelling Neuro: Negative for headaches, weakness, numbness, dizziness, passing out/fainting Psych: Negative for depression, anxiety, memory problems  Objective  Physical Exam Vitals:   04/20/23 1010 04/20/23 1052  BP: (!) 144/96 (!) 140/82  Pulse: 79   Temp: 98 F (36.7 C)   SpO2: 95%     BP Readings from Last 3 Encounters:  04/20/23 (!) 140/82  02/26/21 (!) 145/90  12/10/20 100/64   Wt Readings from Last 3 Encounters:  04/20/23 (!) 472 lb 12.8 oz (214.5 kg)  02/26/21 (!) 420 lb (190.5 kg)  12/10/20 (!) 436 lb 12.8 oz (198.1 kg)    Physical Exam Constitutional:      General: She is not in acute distress.    Appearance: Normal appearance. She is obese.  HENT:     Head: Normocephalic.     Right Ear: Tympanic membrane normal.     Left Ear: Tympanic membrane normal.     Nose: Nose normal.     Mouth/Throat:     Mouth: Mucous membranes are moist.     Pharynx: Oropharynx is clear.  Eyes:     Conjunctiva/sclera:  Conjunctivae normal.     Pupils: Pupils are equal, round, and reactive to light.  Neck:     Thyroid: No thyromegaly.  Cardiovascular:     Rate and Rhythm: Normal rate and regular rhythm.     Heart sounds: Normal heart sounds.  Pulmonary:     Effort: Pulmonary effort is normal.     Breath sounds: Normal breath sounds.  Abdominal:     General: Abdomen is flat. Bowel sounds are normal.     Palpations: Abdomen is soft.     Tenderness: There is no abdominal tenderness.  Musculoskeletal:        General: Normal range of motion.  Lymphadenopathy:     Cervical: No cervical adenopathy.  Skin:    General: Skin is warm and dry.     Findings: No rash.  Neurological:     General: No focal deficit present.     Mental Status: She is alert.  Psychiatric:        Mood and Affect: Mood normal.        Behavior: Behavior normal.    Assessment/Plan:   Encounter for routine adult medical exam with abnormal findings Assessment & Plan: Physical exam complete. We will order routine lab work as outlined and contact the patient with results. Management of her mood disorder will continue with Psychiatry, and management of left knee pain will continue with Orthopedics. She declined the flu vaccine today and has declined all COVID vaccines. She is due for her tetanus vaccine and will get this at her next appointment. We encourage her to follow up with OBGYN for a routine Pap smear in 2025 and to see her Dentist and Optometrist for routine care. Encouraged to continue working on healthy diet and exercise as tolerated. Return to care in 6-8 weeks, sooner as needed.    Elevated blood pressure reading in office without diagnosis of hypertension Assessment & Plan: Elevated blood pressure was noted in the office x 2 readings, and she has a home blood pressure monitor. We will provide a blood pressure log and instruct her to monitor her blood pressure daily at home. Blood pressure control will be re-evaluated at the  next visit or sooner if consistently elevated readings over 140/90 are noted at home. Anticipate improvement with weight loss and improved diet, advised decreasing sodium intake.  Orders: -     CBC with Differential/Platelet -     Comprehensive metabolic panel  BMI 70 and over, adult [facility]) Assessment & Plan: She has a history of binge eating disorder. Due to difficulties initiating physical activity because of her current weight and knee pain, she is interested in weight loss medications. We will initiate Zepbound at the lowest dose, titrating up as tolerated, encourage continued dietary modifications, and increased physical activity as tolerated. Counseled on the risk of pancreatitis and gallbladder disease. Discussed the risk of nausea. Advised to discontinue the Zepbound and contact us immediately if they develop abdominal pain. If they develop excessive nausea they will contact us right away. I discussed that medullary thyroid cancer has been seen in rats studies. The patient confirmed no personal or family history of thyroid cancer, parathyroid cancer, or adrenal gland cancer. Discussed that we thus far have not seen medullary thyroid cancer result from use of this type of medication in humans. Advised to monitor the thyroid area and contact us for any lumps, swelling, trouble swallowing, or any other changes in this area.  Discussed goal weight loss of 1 to 2 pounds a week while on this medication. Discussed the importance of healthy diet, exercise and lifestyle modifications even with this medication. A follow-up is scheduled in 6-8 weeks to assess the tolerance and effectiveness of the medication.  Orders: -     Zepbound; Inject 2.5 mg into the skin once a week. X 4 weeks  Dispense: 2 mL; Refill: 0 -     Zepbound; Inject 5 mg into the skin once a week. X 4 weeks  Dispense: 2 mL; Refill: 0 -     Zepbound; Inject 7.5 mg into the skin once a week. X 4 weeks  Dispense: 2 mL; Refill: 0  Anxiety  with depression Assessment & Plan: Her anxiety and depression is well controlled on Prozac 60 mg daily. PHQ- 6 and GAD- 7 today. She will continue Prozac and follow up with her Psychiatrist as scheduled.   Orders: -     VITAMIN D 25 Hydroxy (Vit-D Deficiency, Fractures)  Iron deficiency Assessment & Plan: The etiology of her iron deficiency anemia remains unclear and is not attributed to heavy menses. She is currently not under the care of Hematology but is taking oral iron supplements. We will order a complete blood count and iron panel today and continue the daily oral iron supplementation.  Orders: -     IBC + Ferritin  Prediabetes -     Hemoglobin A1c  Lipid screening -     Lipid panel  Thyroid disorder screen -     TSH    Return in about 2 months (around 06/21/2023) for Follow up.   Nancy Dicker, NP-C [facility] - [employer]

## 2023-04-20 NOTE — Assessment & Plan Note (Signed)
The etiology of her iron deficiency anemia remains unclear and is not attributed to heavy menses. She is currently not under the care of Hematology but is taking oral iron supplements. We will order a complete blood count and iron panel today and continue the daily oral iron supplementation.

## 2023-04-25 ENCOUNTER — Telehealth: Payer: Self-pay

## 2023-04-25 NOTE — Telephone Encounter (Signed)
PA has been submitted and documented in separate telephone encounter, will continue to update this encounter as well when decision is received

## 2023-04-25 NOTE — Telephone Encounter (Signed)
Noted pt notified.  

## 2023-04-25 NOTE — Telephone Encounter (Signed)
Pharmacy Patient Advocate Encounter   Received notification from Pt Calls Messages that prior authorization for Zepbound 2.5MG /0.5ML pen-injectors is required/requested.   Insurance verification completed.   The patient is insured through [facility] .   Per test claim: PA required; PA submitted to above mentioned insurance via CoverMyMeds Key/confirmation #/EOC [facility] Status is pending

## 2023-04-27 ENCOUNTER — Encounter: Payer: Self-pay | Admitting: Oncology

## 2023-04-27 ENCOUNTER — Other Ambulatory Visit (HOSPITAL_COMMUNITY): Payer: Self-pay

## 2023-04-27 NOTE — Telephone Encounter (Signed)
Pharmacy Patient Advocate Encounter  Received notification from [facility] that Prior Authorization for Zepbound 2.5MG /0.5ML pen-injectors has been APPROVED from 04/25/2023 to 08/29/2023. Ran test claim, Copay is $30.00. This test claim was processed through [facility]- copay amounts may vary at other pharmacies due to pharmacy/plan contracts, or as the patient moves through the different stages of their insurance plan.   PA #/Case ID/Reference #: [id number]

## 2023-05-24 DIAGNOSIS — F330 Major depressive disorder, recurrent, mild: Secondary | ICD-10-CM | POA: Diagnosis not present

## 2023-05-24 DIAGNOSIS — F411 Generalized anxiety disorder: Secondary | ICD-10-CM | POA: Diagnosis not present

## 2023-05-24 DIAGNOSIS — F4310 Post-traumatic stress disorder, unspecified: Secondary | ICD-10-CM | POA: Diagnosis not present

## 2023-06-05 ENCOUNTER — Telehealth: Payer: Self-pay

## 2023-06-05 NOTE — Telephone Encounter (Signed)
PA needed for Zepbound

## 2023-06-07 ENCOUNTER — Other Ambulatory Visit (HOSPITAL_COMMUNITY): Payer: Self-pay

## 2023-06-21 ENCOUNTER — Ambulatory Visit: Payer: BC Managed Care – PPO | Admitting: Nurse Practitioner

## 2023-06-26 ENCOUNTER — Telehealth: Payer: Self-pay

## 2023-06-26 ENCOUNTER — Encounter: Payer: Self-pay | Admitting: Oncology

## 2023-06-26 NOTE — Telephone Encounter (Signed)
PA needed for [employer] has changed/ been updated in chart

## 2023-06-26 NOTE — Telephone Encounter (Signed)
Message to pharmacy team has been sent in on today

## 2023-06-29 NOTE — Telephone Encounter (Signed)
Copied from CRM [id number]. Topic: Clinical - Prescription Issue >> Jun 29, 2023 10:39 AM Sonny Dandy B wrote: Reason for CRM: pt called to request provider to contact her new insurance for a prior authorization  for zepbound, Pt has since changed her insurance to blue cross blue shield.  Please call b/s for prior auth at [phone] for zepbound medication. Marland Kitchen

## 2023-06-29 NOTE — Telephone Encounter (Signed)
PA needed for zepbound pt changed insurance to blue cross clue shield

## 2023-07-04 ENCOUNTER — Telehealth: Payer: Self-pay

## 2023-07-04 ENCOUNTER — Other Ambulatory Visit (HOSPITAL_COMMUNITY): Payer: Self-pay

## 2023-07-04 ENCOUNTER — Encounter: Payer: Self-pay | Admitting: Oncology

## 2023-07-04 NOTE — Telephone Encounter (Signed)
 Copied from CRM [id number]. Topic: Clinical - Prescription Issue >> Jul 04, 2023  9:33 AM Chantha C wrote: Reason for CRM: Patient is checking on status of PA for tirzepatide (ZEPBOUND) 5 MG/0.5ML Pen and possible tirzepatide (ZEPBOUND) 7.5 MG/0.5ML Pen. Patient states she told her last dosage and needs to know what to do, please call back at [phone].   [id number] [place], [place] -  [address] [place] [address] Phone:[phone]:[phone]

## 2023-07-04 NOTE — Telephone Encounter (Signed)
 PA request has been Submitted. New Encounter created for follow up. For additional info see Pharmacy Prior Auth telephone encounter from 07/04/23.

## 2023-07-04 NOTE — Telephone Encounter (Signed)
 Pharmacy Patient Advocate Encounter   Received notification from Pt Calls Messages that prior authorization for ZEPBOUND is required/requested.   Insurance verification completed.   The patient is insured through [facility] .   Per test claim: PA required; PA submitted to above mentioned insurance via CoverMyMeds Key/confirmation #/EOC [id number] Status is pending

## 2023-07-06 NOTE — Telephone Encounter (Signed)
 Pt has been informed of approval and that 7.5 mg was sent to pharmacy

## 2023-07-06 NOTE — Telephone Encounter (Signed)
 PA request has been Approved. New Encounter created for follow up. For additional info see Pharmacy Prior Auth telephone encounter from 07/04/2023. Signing off on duplicate encounter

## 2023-07-06 NOTE — Telephone Encounter (Signed)
 PA request has been Approved. New Encounter created for follow up. For additional info see Pharmacy Prior Auth telephone encounter from 07/04/2023.

## 2023-07-06 NOTE — Telephone Encounter (Signed)
 Pharmacy Patient Advocate Encounter  Received notification from [facility]  that Prior Authorization for Zepbound has been APPROVED from 07/04/2023 to 01/02/2024   PA #/Case ID/Reference #: [id number]

## 2023-07-11 ENCOUNTER — Ambulatory Visit: Payer: BC Managed Care – PPO | Admitting: Nurse Practitioner

## 2023-07-17 DIAGNOSIS — R030 Elevated blood-pressure reading, without diagnosis of hypertension: Secondary | ICD-10-CM | POA: Diagnosis not present

## 2023-07-17 DIAGNOSIS — Z01411 Encounter for gynecological examination (general) (routine) with abnormal findings: Secondary | ICD-10-CM | POA: Diagnosis not present

## 2023-07-17 DIAGNOSIS — Z124 Encounter for screening for malignant neoplasm of cervix: Secondary | ICD-10-CM | POA: Diagnosis not present

## 2023-08-01 ENCOUNTER — Encounter: Payer: Self-pay | Admitting: Nurse Practitioner

## 2023-08-01 MED ORDER — TIRZEPATIDE-WEIGHT MANAGEMENT 10 MG/0.5ML ~~LOC~~ SOAJ: 10.0000 mg | SUBCUTANEOUS | 0 refills | Status: DC

## 2023-08-09 ENCOUNTER — Encounter: Payer: Self-pay | Admitting: Oncology

## 2023-08-16 ENCOUNTER — Encounter: Payer: Self-pay | Admitting: Nurse Practitioner

## 2023-08-16 ENCOUNTER — Ambulatory Visit (INDEPENDENT_AMBULATORY_CARE_PROVIDER_SITE_OTHER): Payer: BC Managed Care – PPO | Admitting: Nurse Practitioner

## 2023-08-16 VITALS — BP 136/68 | HR 86 | Temp 98.0°F | Ht 65.0 in | Wt >= 6400 oz

## 2023-08-16 DIAGNOSIS — R030 Elevated blood-pressure reading, without diagnosis of hypertension: Secondary | ICD-10-CM | POA: Diagnosis not present

## 2023-08-16 DIAGNOSIS — R7303 Prediabetes: Secondary | ICD-10-CM

## 2023-08-16 DIAGNOSIS — F418 Other specified anxiety disorders: Secondary | ICD-10-CM

## 2023-08-16 DIAGNOSIS — F411 Generalized anxiety disorder: Secondary | ICD-10-CM | POA: Diagnosis not present

## 2023-08-16 DIAGNOSIS — Z6841 Body Mass Index (BMI) 40.0 and over, adult: Secondary | ICD-10-CM | POA: Diagnosis not present

## 2023-08-16 DIAGNOSIS — F330 Major depressive disorder, recurrent, mild: Secondary | ICD-10-CM | POA: Diagnosis not present

## 2023-08-16 DIAGNOSIS — F4310 Post-traumatic stress disorder, unspecified: Secondary | ICD-10-CM | POA: Diagnosis not present

## 2023-08-16 MED ORDER — ZEPBOUND 15 MG/0.5ML ~~LOC~~ SOAJ: 15.0000 mg | SUBCUTANEOUS | 1 refills | Status: DC

## 2023-08-16 MED ORDER — ZEPBOUND 12.5 MG/0.5ML ~~LOC~~ SOAJ: 12.5000 mg | SUBCUTANEOUS | 0 refills | Status: DC

## 2023-08-16 NOTE — Assessment & Plan Note (Signed)
 Blood pressure slightly elevated but not requiring medication. Continued weight loss expected to improve blood pressure. Monitor blood pressure at home, contact if consistently above 140/90 mmHg. Encourage weight loss and regular exercise.

## 2023-08-16 NOTE — Assessment & Plan Note (Signed)
 A1c in prediabetic range- 6.0. Improvements expected with weight loss and lifestyle changes. Monitor A1c levels and continue lifestyle modifications.

## 2023-08-16 NOTE — Assessment & Plan Note (Signed)
 Her anxiety and depression is well controlled on Prozac 60 mg daily. She will continue Prozac and follow up with her Psychiatrist as scheduled.

## 2023-08-16 NOTE — Assessment & Plan Note (Addendum)
 She has lost 32 pounds on Zepbound thus far with decreased nausea, appetite, and 'food noise'. Plans to resume healthy eating and exercise despite knee pain. Titrate Zepbound to 12.5 mg and then 15 mg based on tolerance and side effects after completing 10 mg regimen. Encourage regular physical activity and finding a gym. Monitor weight loss progress and adjust treatment as needed. Localized hive-like reaction at Zepbound injection site, itchy initially, subsides after a few days. Continue using Benadryl cream for symptomatic relief. Monitor the injection site reaction for any changes or worsening.

## 2023-08-16 NOTE — Progress Notes (Signed)
 Nancy Dicker, NP-C Phone: [phone]  Nancy Molina is a 33 y.o. female who presents today for weight management.  Discussed the use of AI scribe software for clinical note transcription with the patient, who gave verbal consent to proceed.  History of Present Illness   Nancy Molina is a 33 year old female who presents for a follow-up regarding weight management and medication side effects.  Nancy Molina has been on Zepbound, starting at 2.5 mg, then 7.5 mg, and recently increased to 10 mg. Since starting the medication on January 1st, Nancy Molina has lost 32 pounds, noting a significant improvement in nausea and a decrease in 'food noise'. Nancy Molina appetite has decreased, and Nancy Molina is consuming smaller portions and healthier options, although recent dietary habits were affected by a move to [place]. Despite this, Nancy Molina lost 5 pounds in the last week.  Nancy Molina experiences a localized hive-like reaction at the injection site since increasing Nancy Molina medication dose from 7.5 mg to 10 mg. The reaction appears the day after the injection, lasts about a week, and resolves before the next dose. It is itchy for the first couple of days, then subsides. Nancy Molina has tried alternating injection sites, including Nancy Molina thigh, but the reaction persists. Nancy Molina uses Benadryl cream, which provides some relief.  Nancy Molina mentions a previous knee injury that has been aggravated by moving activities, causing pain, especially with stair climbing. This has limited Nancy Molina ability to engage in regular exercise, although Nancy Molina is looking into finding a gym in [place] new area.   Nancy Molina blood pressure was noted to be higher during a recent Pap smear appointment, but Nancy Molina does not recall the exact values. Nancy Molina has a home blood pressure machine and is monitoring Nancy Molina levels. Nancy Molina last recorded blood pressure was 136/68.      Social History   Tobacco Use  Smoking Status Never  Smokeless Tobacco Never    Current Outpatient Medications on File Prior to Visit   Medication Sig Dispense Refill   desogestrel-ethinyl estradiol (APRI) 0.15-30 MG-MCG tablet TAKE 1 TABLET BY MOUTH EVERY DAY     FLUoxetine (PROZAC) 20 MG capsule Take 20 mg by mouth.     FLUoxetine (PROZAC) 40 MG capsule Take 40 mg by mouth.     Iron-Vitamin C 65-125 MG TABS Take 1 tablet by mouth daily. 90 tablet 1   tirzepatide (ZEPBOUND) 10 MG/0.5ML Pen Inject 10 mg into the skin once a week. 2 mL 0   No current facility-administered medications on file prior to visit.    ROS see history of present illness  Objective  Physical Exam Vitals:   08/16/23 0859  BP: 136/68  Pulse: 86  Temp: 98 F (36.7 C)  SpO2: 97%    BP Readings from Last 3 Encounters:  08/16/23 136/68  04/20/23 (!) 140/82  02/26/21 (!) 145/90   Wt Readings from Last 3 Encounters:  08/16/23 (!) 454 lb 3.2 oz (206 kg)  04/20/23 (!) 472 lb 12.8 oz (214.5 kg)  02/26/21 (!) 420 lb (190.5 kg)    Physical Exam Constitutional:      General: Nancy Molina is not in acute distress.    Appearance: Normal appearance. Nancy Molina is obese.  HENT:     Head: Normocephalic.  Cardiovascular:     Rate and Rhythm: Normal rate and regular rhythm.     Heart sounds: Normal heart sounds.  Pulmonary:     Effort: Pulmonary effort is normal.     Breath sounds: Normal breath sounds.  Skin:  General: Skin is warm and dry.  Neurological:     General: No focal deficit present.     Mental Status: Nancy Molina is alert.  Psychiatric:        Mood and Affect: Mood normal.        Behavior: Behavior normal.     Assessment/Plan: Please see individual problem list.  BMI 70 and over, adult [facility]) Assessment & Plan: Nancy Molina has lost 32 pounds on Zepbound thus far with decreased nausea, appetite, and 'food noise'. Plans to resume healthy eating and exercise despite knee pain. Titrate Zepbound to 12.5 mg and then 15 mg based on tolerance and side effects after completing 10 mg regimen. Encourage regular physical activity and finding a gym. Monitor weight  loss progress and adjust treatment as needed. Localized hive-like reaction at Zepbound injection site, itchy initially, subsides after a few days. Continue using Benadryl cream for symptomatic relief. Monitor the injection site reaction for any changes or worsening.  Orders: -     Zepbound; Inject 12.5 mg into the skin once a week.  Dispense: 2 mL; Refill: 0 -     Zepbound; Inject 15 mg into the skin once a week.  Dispense: 6 mL; Refill: 1  Elevated blood pressure reading in office without diagnosis of hypertension Assessment & Plan: Blood pressure slightly elevated but not requiring medication. Continued weight loss expected to improve blood pressure. Monitor blood pressure at home, contact if consistently above 140/90 mmHg. Encourage weight loss and regular exercise.   Prediabetes Assessment & Plan: A1c in prediabetic range- 6.0. Improvements expected with weight loss and lifestyle changes. Monitor A1c levels and continue lifestyle modifications.    Anxiety with depression Assessment & Plan: Nancy Molina anxiety and depression is well controlled on Prozac 60 mg daily. Nancy Molina will continue Prozac and follow up with Nancy Molina Psychiatrist as scheduled.      Return in about 3 months (around 11/15/2023) for Follow up.   Nancy Dicker, NP-C [facility] - [facility]

## 2023-09-12 ENCOUNTER — Telehealth: Payer: Self-pay

## 2023-09-12 ENCOUNTER — Other Ambulatory Visit (HOSPITAL_COMMUNITY): Payer: Self-pay

## 2023-09-12 ENCOUNTER — Encounter: Payer: Self-pay | Admitting: Oncology

## 2023-09-12 NOTE — Telephone Encounter (Signed)
 Pharmacy Patient Advocate Encounter   Received notification from CoverMyMeds that prior authorization for Zepbound  2.5MG /0.5ML pen-injectors is due for renewal.   Insurance verification completed.   The patient is insured through [facility].  The current PA is valid through 01/02/24. PA is not needed at this time. Archived Key: [id number]

## 2023-10-15 ENCOUNTER — Other Ambulatory Visit (HOSPITAL_COMMUNITY): Payer: Self-pay

## 2023-10-22 ENCOUNTER — Other Ambulatory Visit (HOSPITAL_COMMUNITY): Payer: Self-pay

## 2023-10-22 ENCOUNTER — Telehealth: Payer: Self-pay

## 2023-10-22 ENCOUNTER — Encounter: Payer: Self-pay | Admitting: Nurse Practitioner

## 2023-10-22 NOTE — Telephone Encounter (Signed)
 Pharmacy Patient Advocate Encounter   Received notification from CoverMyMeds that prior authorization for  Zepbound  10MG /0.5ML pen-injectors is required/requested.   Insurance verification completed.   The patient is insured through [facility] .   Per test claim: PA required; PA submitted to above mentioned insurance via CoverMyMeds Key/confirmation #/EOC [id number] Status is pending

## 2023-10-22 NOTE — Telephone Encounter (Signed)
 PA needed for zepbound

## 2023-10-23 ENCOUNTER — Other Ambulatory Visit (HOSPITAL_COMMUNITY): Payer: Self-pay

## 2023-10-23 ENCOUNTER — Encounter: Payer: Self-pay | Admitting: Oncology

## 2023-10-23 NOTE — Telephone Encounter (Signed)
 Patient's new insurance in [place] states it is terminated. I called patient and LM to contact me back to update so we can proceed with prior auth request.

## 2023-10-23 NOTE — Telephone Encounter (Signed)
 PA request has been Started. New Encounter has been or will be created for follow up. For additional info see Pharmacy Prior Auth telephone encounter from 10/22/2023. Patient's new insurance listed in [place] is stating teminated. LM on voicemail for patient to call back and update info so we can proceed with prior auth request.

## 2023-10-25 ENCOUNTER — Encounter: Payer: Self-pay | Admitting: Oncology

## 2023-10-25 ENCOUNTER — Other Ambulatory Visit (HOSPITAL_COMMUNITY): Payer: Self-pay

## 2023-10-31 ENCOUNTER — Encounter: Payer: Self-pay | Admitting: Nurse Practitioner

## 2023-11-07 ENCOUNTER — Other Ambulatory Visit (HOSPITAL_COMMUNITY): Payer: Self-pay

## 2023-11-07 ENCOUNTER — Telehealth: Payer: Self-pay

## 2023-11-07 ENCOUNTER — Encounter: Payer: Self-pay | Admitting: Oncology

## 2023-11-07 NOTE — Telephone Encounter (Signed)
 Pharmacy Patient Advocate Encounter  Received notification from [employer] that Prior Authorization for [employer] has been DENIED.  Full denial letter will be uploaded to the media tab. See denial reason below.   This medication is excluded from the patient's benefit. Weight Loss Medications not covered.   I called plan and they did a special request and it was still denied. Reference #: [id number]  They stated we can appeal if needed, once we receive denial letter.

## 2023-11-07 NOTE — Telephone Encounter (Signed)
 Pharmacy Patient Advocate Encounter   Received notification from CoverMyMeds that prior authorization for Zepbound  10MG /0.5ML pen-injectors is required/requested.   Insurance verification completed.   The patient is insured through [employer] .   Per test claim: PA required; PA submitted to above mentioned insurance via CoverMyMeds Key/confirmation #/[id number] Status is pending

## 2023-11-19 ENCOUNTER — Ambulatory Visit: Admitting: Nurse Practitioner

## 2023-12-14 ENCOUNTER — Other Ambulatory Visit: Payer: Self-pay | Admitting: Nurse Practitioner

## 2023-12-14 ENCOUNTER — Telehealth: Payer: Self-pay

## 2023-12-14 DIAGNOSIS — Z6841 Body Mass Index (BMI) 40.0 and over, adult: Secondary | ICD-10-CM

## 2023-12-14 DIAGNOSIS — G4733 Obstructive sleep apnea (adult) (pediatric): Secondary | ICD-10-CM | POA: Insufficient documentation

## 2023-12-14 MED ORDER — ZEPBOUND 5 MG/0.5ML ~~LOC~~ SOAJ: 5.0000 mg | SUBCUTANEOUS | 0 refills | Status: AC

## 2023-12-14 NOTE — Telephone Encounter (Signed)
 PA needed for zepbound  Dx used is OSA

## 2023-12-17 ENCOUNTER — Other Ambulatory Visit (HOSPITAL_COMMUNITY): Payer: Self-pay

## 2023-12-17 ENCOUNTER — Telehealth: Payer: Self-pay

## 2023-12-17 NOTE — Telephone Encounter (Signed)
 Can updates on this PA be routed to the [place] [facility]   Thanks

## 2023-12-17 NOTE — Telephone Encounter (Signed)
 Pharmacy Patient Advocate Encounter   Received notification from CoverMyMeds that prior authorization for Zepbound  5MG /0.5ML pen-injectors is required/requested.   Insurance verification completed.   The patient is insured through [employer] .   Im calling [employer] directly because Cover my meds will not allow me to submit after sending demographic event. It states

## 2023-12-20 ENCOUNTER — Other Ambulatory Visit: Payer: Self-pay | Admitting: Nurse Practitioner

## 2023-12-20 ENCOUNTER — Other Ambulatory Visit (HOSPITAL_COMMUNITY): Payer: Self-pay

## 2023-12-20 DIAGNOSIS — G4733 Obstructive sleep apnea (adult) (pediatric): Secondary | ICD-10-CM

## 2023-12-21 ENCOUNTER — Other Ambulatory Visit: Payer: Self-pay | Admitting: Medical Genetics

## 2023-12-21 ENCOUNTER — Encounter: Payer: Self-pay | Admitting: Oncology

## 2023-12-21 NOTE — Telephone Encounter (Signed)
 Yes it was. Drug is not covered. I just got off the phone with insurance to verify. He stated I could appeal request and sleep study info and chart notes in for an urgent appeal. Sending over info and will update note once it is faxed.

## 2024-01-16 ENCOUNTER — Other Ambulatory Visit (HOSPITAL_COMMUNITY): Payer: Self-pay

## 2024-02-22 ENCOUNTER — Encounter: Payer: Self-pay | Admitting: Oncology

## 2024-03-11 ENCOUNTER — Other Ambulatory Visit: Payer: Self-pay | Admitting: Medical Genetics

## 2024-03-11 DIAGNOSIS — Z006 Encounter for examination for normal comparison and control in clinical research program: Secondary | ICD-10-CM

## 2024-04-07 LAB — GENECONNECT MOLECULAR SCREEN: Genetic Analysis Overall Interpretation: NEGATIVE

## 2024-05-20 ENCOUNTER — Encounter: Payer: Self-pay | Admitting: Oncology

## 2024-05-27 ENCOUNTER — Encounter: Payer: Self-pay | Admitting: Oncology

## 2024-07-08 ENCOUNTER — Ambulatory Visit: Admitting: Psychology

## 2024-07-21 ENCOUNTER — Other Ambulatory Visit: Admitting: Psychology

## 2024-07-24 ENCOUNTER — Other Ambulatory Visit: Admitting: Psychology

## 2024-07-30 ENCOUNTER — Ambulatory Visit: Admitting: Psychology
